# Patient Record
Sex: Female | Born: 1965
Health system: Southern US, Community
[De-identification: ages and names within clinical notes are randomized; demographics above are authoritative.]

## PROBLEM LIST (undated history)

## (undated) DIAGNOSIS — C801 Malignant (primary) neoplasm, unspecified: Secondary | ICD-10-CM

## (undated) DIAGNOSIS — K219 Gastro-esophageal reflux disease without esophagitis: Secondary | ICD-10-CM

## (undated) DIAGNOSIS — K8020 Calculus of gallbladder without cholecystitis without obstruction: Secondary | ICD-10-CM

## (undated) DIAGNOSIS — K589 Irritable bowel syndrome without diarrhea: Secondary | ICD-10-CM

## (undated) DIAGNOSIS — G43909 Migraine, unspecified, not intractable, without status migrainosus: Secondary | ICD-10-CM

## (undated) DIAGNOSIS — R32 Unspecified urinary incontinence: Secondary | ICD-10-CM

## (undated) DIAGNOSIS — J302 Other seasonal allergic rhinitis: Secondary | ICD-10-CM

## (undated) DIAGNOSIS — E785 Hyperlipidemia, unspecified: Secondary | ICD-10-CM

## (undated) DIAGNOSIS — Z8619 Personal history of other infectious and parasitic diseases: Secondary | ICD-10-CM

## (undated) DIAGNOSIS — T7840XA Allergy, unspecified, initial encounter: Secondary | ICD-10-CM

## (undated) DIAGNOSIS — Z87440 Personal history of urinary (tract) infections: Secondary | ICD-10-CM

## (undated) HISTORY — DX: Gastro-esophageal reflux disease without esophagitis: K21.9

## (undated) HISTORY — DX: Personal history of other infectious and parasitic diseases: Z86.19

## (undated) HISTORY — DX: Irritable bowel syndrome, unspecified: K58.9

## (undated) HISTORY — DX: Hyperlipidemia, unspecified: E78.5

## (undated) HISTORY — DX: Malignant (primary) neoplasm, unspecified: C80.1

## (undated) HISTORY — DX: Allergy, unspecified, initial encounter: T78.40XA

## (undated) HISTORY — DX: Calculus of gallbladder without cholecystitis without obstruction: K80.20

## (undated) HISTORY — DX: Migraine, unspecified, not intractable, without status migrainosus: G43.909

## (undated) HISTORY — DX: Personal history of urinary (tract) infections: Z87.440

## (undated) HISTORY — PX: MANDIBLE SURGERY: SHX707

## (undated) HISTORY — PX: DILATION AND CURETTAGE OF UTERUS: SHX78

## (undated) HISTORY — PX: COSMETIC SURGERY: SHX468

## (undated) HISTORY — DX: Unspecified urinary incontinence: R32

## (undated) HISTORY — DX: Other seasonal allergic rhinitis: J30.2

## (undated) HISTORY — PX: POLYPECTOMY: SHX149

---

## 2008-04-11 ENCOUNTER — Ambulatory Visit: Payer: Self-pay | Admitting: Family Medicine

## 2008-04-11 DIAGNOSIS — K589 Irritable bowel syndrome without diarrhea: Secondary | ICD-10-CM

## 2008-04-11 DIAGNOSIS — E785 Hyperlipidemia, unspecified: Secondary | ICD-10-CM

## 2008-04-11 LAB — CONVERTED CEMR LAB: Pap Smear: NORMAL

## 2008-05-02 ENCOUNTER — Ambulatory Visit: Payer: Self-pay | Admitting: Unknown Physician Specialty

## 2008-05-04 ENCOUNTER — Ambulatory Visit: Payer: Self-pay | Admitting: Family Medicine

## 2008-05-08 ENCOUNTER — Ambulatory Visit: Payer: Self-pay | Admitting: Family Medicine

## 2008-05-15 ENCOUNTER — Ambulatory Visit: Payer: Self-pay | Admitting: Family Medicine

## 2008-05-18 LAB — CONVERTED CEMR LAB
ALT: 20 units/L (ref 0–35)
Albumin: 3.6 g/dL (ref 3.5–5.2)
BUN: 17 mg/dL (ref 6–23)
CO2: 25 meq/L (ref 19–32)
Calcium: 8.9 mg/dL (ref 8.4–10.5)
Cholesterol: 235 mg/dL (ref 0–200)
Creatinine, Ser: 0.7 mg/dL (ref 0.4–1.2)
Direct LDL: 158.6 mg/dL
Glucose, Bld: 75 mg/dL (ref 70–99)
Total CHOL/HDL Ratio: 5.7
Total Protein: 8.3 g/dL (ref 6.0–8.3)
Triglycerides: 124 mg/dL (ref 0–149)

## 2008-06-22 ENCOUNTER — Ambulatory Visit: Payer: Self-pay | Admitting: Family Medicine

## 2008-06-22 LAB — CONVERTED CEMR LAB
Bilirubin Urine: NEGATIVE
Specific Gravity, Urine: 1.015
Urobilinogen, UA: 0.2

## 2008-08-17 ENCOUNTER — Ambulatory Visit: Payer: Self-pay | Admitting: Family Medicine

## 2008-08-18 LAB — CONVERTED CEMR LAB
ALT: 15 units/L (ref 0–35)
AST: 21 units/L (ref 0–37)
HDL: 37.8 mg/dL — ABNORMAL LOW (ref >39.00)
Triglycerides: 180 mg/dL — ABNORMAL HIGH (ref 0.0–149.0)
VLDL: 36 mg/dL (ref 0.0–40.0)

## 2008-08-23 ENCOUNTER — Ambulatory Visit: Payer: Self-pay | Admitting: Family Medicine

## 2008-11-27 ENCOUNTER — Ambulatory Visit: Payer: Self-pay | Admitting: Family Medicine

## 2008-11-28 LAB — CONVERTED CEMR LAB
ALT: 14 units/L (ref 0–35)
AST: 21 units/L (ref 0–37)
HDL: 39.9 mg/dL (ref >39.00)
Total CHOL/HDL Ratio: 6

## 2008-12-23 ENCOUNTER — Emergency Department: Payer: Self-pay | Admitting: Emergency Medicine

## 2009-05-03 ENCOUNTER — Ambulatory Visit: Payer: Self-pay | Admitting: Unknown Physician Specialty

## 2009-07-11 ENCOUNTER — Ambulatory Visit: Payer: Self-pay | Admitting: Family Medicine

## 2009-09-04 ENCOUNTER — Ambulatory Visit: Payer: Self-pay | Admitting: Family Medicine

## 2009-09-04 DIAGNOSIS — B354 Tinea corporis: Secondary | ICD-10-CM | POA: Insufficient documentation

## 2010-04-11 ENCOUNTER — Telehealth: Payer: Self-pay | Admitting: Family Medicine

## 2010-05-07 ENCOUNTER — Ambulatory Visit: Payer: Self-pay | Admitting: Unknown Physician Specialty

## 2010-06-05 NOTE — Progress Notes (Signed)
Summary: ? UTI  Phone Note Call from Patient Call back at Work Phone [phone]   Caller: Patient Summary of Call: Pt thinks she has a UTI and is asking if she can come in and drop off a urine sample, there are no appts available. Initial call taken by: Lowella Petties CMA, AAMA,  April 11, 2010 8:34 AM  Follow-up for Phone Call        let her know there was a policy change and we can no longer tx without visit offer first avail  if none-- UC  Follow-up by: Judith Part MD,  April 11, 2010 9:26 AM  Additional Follow-up for Phone Call Additional follow up Details #1::        Advised pt, she will call back tomorrow for a same day appt. Additional Follow-up by: Lowella Petties CMA, AAMA,  April 11, 2010 11:15 AM

## 2010-06-05 NOTE — Assessment & Plan Note (Signed)
Summary: COLD/CLE   Vital Signs:  Patient profile:   45 year old female Height:      66.25 inches Weight:      150.50 pounds BMI:     24.20 Temp:     98.4 degrees F oral Pulse rate:   68 / minute Pulse rhythm:   regular BP sitting:   100 / 70  (left arm) Cuff size:   regular  Vitals Entered By: Lewanda Rife LPN (July 11, 3084 9:21 AM)  History of Present Illness: 11 days of uri symptoms - congestion and cough  is improved but not gone  still blowing nose -- mostly clear d/c  couging is persistant- with rattle and loose mucous- but not getting it out  no fever  no facial pain   no GI symptoms   throat and ears are ok   was taking tylenol cold- not helpful   ? if this is related to allergies as well   Allergies: 1)  ! Amoxicillin 2)  ! Claritin 3)  ! 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 Eyes:  Denies blurring and eye irritation. ENT:  Complains of nasal congestion, postnasal drainage, and sore throat; denies earache and sinus pressure. CV:  Denies chest pain or discomfort and  palpitations. Resp:  Complains of cough; denies wheezing. GI:  Denies diarrhea, nausea, and vomiting. Derm:  Denies rash.  Physical Exam  General:  Well-developed,well-nourished,in no acute distress; alert,appropriate and cooperative throughout examination Head:  normocephalic, atraumatic, and no abnormalities observed.  no sinus tenderness  Eyes:  vision grossly intact, pupils equal, pupils round, pupils reactive to light, and no injection.   Ears:  R ear normal and L ear normal.   Nose:  nares are boggy and mildly congested  Mouth:  pharynx pink and moist, no erythema, and no exudates.   Neck:  No deformities, masses, or tenderness noted. Lungs:  Normal respiratory effort, chest expands symmetrically. Lungs are clear to auscultation, no crackles or wheezes. Heart:  Normal rate and regular rhythm. S1 and S2 normal without gallop, murmur, click, rub or other extra sounds. Skin:  Intact without suspicious lesions or rashes Cervical Nodes:  No lymphadenopathy noted Psych:  normal affect, talkative and pleasant    Impression & Recommendations:  Problem # 1:  URI (ICD-465.9) Assessment New with nasal symptoms and persistant cough no signs of bact infx on exam  rec allegra for nasal symptoms (is intol to claritin and zyrtec) tessalon for cough recommend sympt care- see pt instructions   pt advised to update me if symptoms worsen or do not improve  Her updated medication list for this problem includes:    Allegra 180 Mg Tabs (  Fexofenadine hcl) .Marland Kitchen... 1 by mouth once daily as needed runny nose/ congestion    Tessalon 200 Mg Caps (Benzonatate) .Marland Kitchen... 1 by mouth up to three times a day as needed cough  Complete Medication List: 1)  Trivora (28) Tabs (Levonorg-eth estrad triphasic) .... One by mouth daily as directed 2)  Flax Oil (Flaxseed (linseed)) .... Take one daily 3)  Allegra 180 Mg Tabs (Fexofenadine hcl) .Marland Kitchen.. 1 by mouth once daily as needed runny nose/ congestion 4)  Tessalon 200  Mg Caps (Benzonatate) .Marland Kitchen.. 1 by mouth up to three times a day as needed cough  Patient Instructions: 1)  drink lots of fluids 2)  try tessalon for cough as needed 3)  try the allegra for nasal symptoms and also allergies  4)  if worse or facial pain or fever or not improved in a week- please call and let me know  Prescriptions: TESSALON 200 MG CAPS (BENZONATATE) 1 by mouth up to three times a day as needed cough  #30 x 0   Entered and Authorized by:   Judith Part MD   Signed by:   Judith Part MD on 07/11/2009   Method used:   Electronically to        [facility] (retail)       [address]       [place], [place]  [address]       Ph: [phone] or [phone]       Fax: [phone]   RxID:   [id number] ALLEGRA 180 MG TABS (FEXOFENADINE HCL) 1 by mouth once daily as needed runny nose/ congestion  #30 x 11   Entered and Authorized by:   Judith Part MD   Signed by:   Judith Part MD on 07/11/2009   Method used:   [facility]        [facility] (retail)       [address]       [place], [place]  [address]       Ph: [phone] or [phone]       Fax: [phone]   RxID:   [id number]   Current Allergies (reviewed today): ! AMOXICILLIN ! CLARITIN ! ZYRTEC ALLERGY

## 2010-06-05 NOTE — Assessment & Plan Note (Signed)
Summary: CHECK PLACE ON BEHIND/CLE   Vital Signs:  Patient profile:   45 year old female Height:      66.25 inches Weight:      153.25 pounds BMI:     24.64 Temp:     98.2 degrees F oral Pulse rate:   68 / minute Pulse rhythm:   regular BP sitting:   100 / 70  (left arm) Cuff size:   regular  Vitals Entered By: Lewanda Rife LPN (Sep 05, 2954 4:04 PM) CC: ck rashy and itchy area on buttock   History of Present Illness: itchy rash on buttock  6 months - went away and then came back   is really hard for her to see  ? feels like whelps  does try not to scratch no oozing   no new products at all   no hx of herpes of any type   of note- she did have a bad vaginal yeast infx 6 mo ago that required pill and topical tx- that is better  Allergies: 1)  ! Amoxicillin 2)  ! Claritin 3)  ! 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 Eyes:  Denies discharge and halos. CV:  Denies chest pain or discomfort and palpitations. Resp:  Denies cough. GI:  Denies change in bowel habits, nausea, and vomiting. MS:   Denies joint pain. Derm:  Complains of itching, lesion(s), and rash. Neuro:  Denies numbness and tingling.  Physical Exam  General:  Well-developed,well-nourished,in no acute distress; alert,appropriate and cooperative throughout examination Head:  normocephalic, atraumatic, and no abnormalities observed.   Mouth:  pharynx pink and moist.   Neck:  No deformities, masses, or tenderness noted. Lungs:  Normal respiratory effort, chest expands symmetrically. Lungs are clear to auscultation, no crackles or wheezes. Heart:  Normal rate and regular rhythm. S1 and S2 normal without gallop, murmur, click, rub or other extra sounds. Abdomen:  no suprapubic tenderness or fullness felt  Neurologic:  sensation intact to light touch, gait normal, and DTRs symmetrical and normal.   Skin:  skin on outer buttocks-- left more than R - several oval lesions of scale/ erythema and central clearing no vesicle formation / no plaque (is only slt raised) anal area is clear  Inguinal Nodes:  No significant adenopathy Psych:  normal affect, talkative and pleasant    Impression & Recommendations:  Problem # 1:  TINEA CORPORIS (ICD-110.5) Assessment New with affected area on outer buttocks - itchy and resembling ringworm will tx with lotrisone cream two times a day  adv to keep clean and dry update if not much imp in 10d- would consider derm eval   Complete Medication List: 1)  Trivora (28)  Tabs (Levonorg-eth estrad triphasic) .... One by mouth daily as directed 2)  Flax Oil (Flaxseed (linseed)) .... Take one daily 3)  Allegra 180 Mg Tabs (Fexofenadine hcl) .Marland Kitchen.. 1 by mouth once daily as needed runny nose/ congestion 4)  Lotrisone 1-0.05 % Crea (Clotrimazole-betamethasone) .... Apply to affected area two times a day as needed  Patient Instructions: 1)  keep area as clean/ cool and dry as possible  2)  use the lotrisone cream two times a day until clear  3)  I sent to your pharmacy  4)  update me in 10-14  days if not improving  Prescriptions: LOTRISONE 1-0.05 % CREA (CLOTRIMAZOLE-BETAMETHASONE) apply to affected area two times a day as needed  #1 medium x 0   Entered and Authorized by:   Judith Part MD   Signed by:   Judith Part MD on 09/04/2009   Method used:   Electronically to        [facility] (retail)       [address]       [place], [place]  [address]       Ph: [phone]       Fax: [phone]   RxID:   [id number]   Current Allergies (reviewed today): ! AMOXICILLIN ! CLARITIN ! ZYRTEC ALLERGY

## 2011-10-03 ENCOUNTER — Other Ambulatory Visit: Payer: Self-pay

## 2011-10-06 ENCOUNTER — Telehealth: Payer: Self-pay | Admitting: Family Medicine

## 2011-10-06 ENCOUNTER — Other Ambulatory Visit (INDEPENDENT_AMBULATORY_CARE_PROVIDER_SITE_OTHER): Payer: Self-pay

## 2011-10-06 DIAGNOSIS — Z0000 Encounter for general adult medical examination without abnormal findings: Secondary | ICD-10-CM | POA: Insufficient documentation

## 2011-10-06 DIAGNOSIS — E785 Hyperlipidemia, unspecified: Secondary | ICD-10-CM

## 2011-10-06 LAB — CBC WITH DIFFERENTIAL/PLATELET
Basophils Absolute: 0 10*3/uL (ref 0.0–0.1)
Eosinophils Relative: 0 % (ref 0.0–5.0)
Lymphs Abs: 0.8 10*3/uL (ref 0.7–4.0)
Monocytes Relative: 9.8 % (ref 3.0–12.0)
Neutrophils Relative %: 69 % (ref 43.0–77.0)
Platelets: 243 10*3/uL (ref 150.0–400.0)
RDW: 13.4 % (ref 11.5–14.6)
WBC: 4 10*3/uL — ABNORMAL LOW (ref 4.5–10.5)

## 2011-10-06 LAB — COMPREHENSIVE METABOLIC PANEL
ALT: 14 U/L (ref 0–35)
Albumin: 3.7 g/dL (ref 3.5–5.2)
Alkaline Phosphatase: 46 U/L (ref 39–117)
CO2: 26 mEq/L (ref 19–32)
GFR: 93.71 mL/min (ref >60.00)
Glucose, Bld: 78 mg/dL (ref 70–99)
Potassium: 4.3 mEq/L (ref 3.5–5.1)
Sodium: 141 mEq/L (ref 135–145)
Total Bilirubin: 0.5 mg/dL (ref 0.3–1.2)
Total Protein: 7.6 g/dL (ref 6.0–8.3)

## 2011-10-06 LAB — LIPID PANEL
Cholesterol: 203 mg/dL — ABNORMAL HIGH (ref 0–200)
Total CHOL/HDL Ratio: 4
VLDL: 19.8 mg/dL (ref 0.0–40.0)

## 2011-10-06 LAB — LDL CHOLESTEROL, DIRECT: Direct LDL: 139.5 mg/dL

## 2011-10-06 NOTE — Telephone Encounter (Signed)
Message copied by Judy Pimple on Mon Oct 06, 2011  1:35 PM ------      Message from: Alvina Chou      Created: Mon Oct 06, 2011  9:02 AM      Regarding: labs today       Patient is scheduled for CPX labs, please order future labs, Thanks , Terri             Blood in lab, thanks

## 2011-10-08 ENCOUNTER — Encounter: Payer: Self-pay | Admitting: Family Medicine

## 2011-10-08 ENCOUNTER — Ambulatory Visit (INDEPENDENT_AMBULATORY_CARE_PROVIDER_SITE_OTHER): Payer: 59 | Admitting: Family Medicine

## 2011-10-08 VITALS — BP 100/68 | HR 72 | Temp 98.0°F | Ht 65.75 in | Wt 160.5 lb

## 2011-10-08 DIAGNOSIS — E785 Hyperlipidemia, unspecified: Secondary | ICD-10-CM

## 2011-10-08 DIAGNOSIS — Z0000 Encounter for general adult medical examination without abnormal findings: Secondary | ICD-10-CM

## 2011-10-08 DIAGNOSIS — Z23 Encounter for immunization: Secondary | ICD-10-CM

## 2011-10-08 NOTE — Patient Instructions (Signed)
Tdap vaccine today  Avoid red meat/ fried foods/ egg yolks/ fatty breakfast meats/ butter, cheese and high fat dairy/ and shellfish   Here is a handout on cholesterol  Try to work in exercise 5 days per week -- work up to 30 minutes  It will help your energy level too  Make follow up appt for your other issues when able

## 2011-10-08 NOTE — Assessment & Plan Note (Signed)
Reviewed health habits including diet and exercise and skin cancer prevention Also reviewed health mt list, fam hx and immunizations   Reviewed wellness labs in detail Rev plan for low chol diet and also exercise program 5 d per week  Tdap today

## 2011-10-08 NOTE — Progress Notes (Signed)
Subjective:    Patient ID: Nicole Shelton, female    DOB: 23-Apr-1966, 46 y.o.   MRN: [id number]  HPI Here for health maintenance exam and to review chronic medical problems    Also has some new problems - acid reflux/ stomach issues and also hand tingling  Otherwise doing fine   Is taking good care of herself   Last gyn visit was in February - Dr Genice Rouge  Had an exam -- is not doing the pap every single year  Had a mammo in feb as well - that was normal   Did not get a flu shot this year - may consider in the future   Is not on OC now  Husband had a vasectomy -- and is happy with that   No new family hx of anything except neuropathy in her mother (no particular etiol) - leg pain   No meds at all now  No supplements   Wt is up 7 lb with bmi 26  Diet is not optimal  Eating on the run and eating while busy  Gaining more around the middle Does not exercise at all   Is difficult to fit exercise into her schedule  Job and family  Is getting enough sleep at night   Td - ? When the last one was --will do that today     Chemistry      Component Value Date/Time   NA 141 10/06/2011 1401   K 4.3 10/06/2011 1401   CL 108 10/06/2011 1401   CO2 26 10/06/2011 1401   BUN 12 10/06/2011 1401   CREATININE 0.7 10/06/2011 1401      Component Value Date/Time   CALCIUM 8.7 10/06/2011 1401   ALKPHOS 46 10/06/2011 1401   AST 19 10/06/2011 1401   ALT 14 10/06/2011 1401   BILITOT 0.5 10/06/2011 1401      Lab Results  Component Value Date   WBC 4.0* 10/06/2011   HGB 12.7 10/06/2011   HCT 36.7 10/06/2011   MCV 86.7 10/06/2011   PLT 243.0 10/06/2011    Lab Results  Component Value Date   TSH 1.88 10/06/2011    Lab Results  Component Value Date   CHOL 203* 10/06/2011   CHOL 225* 11/27/2008   CHOL 223* 08/17/2008   Lab Results  Component Value Date   HDL 45.40 10/06/2011   HDL 39.90 11/27/2008   HDL 29.52* 08/17/2008   No results found for this basename: LDLCALC   Lab Results  Component Value Date   TRIG 99.0 10/06/2011   TRIG 142.0 11/27/2008   TRIG 180.0* 08/17/2008   Lab Results  Component Value Date   CHOLHDL 4 10/06/2011   CHOLHDL 6 11/27/2008   CHOLHDL 6 08/17/2008   Lab Results  Component Value Date   LDLDIRECT 139.5 10/06/2011   LDLDIRECT 149.0 11/27/2008   LDLDIRECT 144.0 08/17/2008   does have fam hx of high chol  She has had high chol all her life  Diet is fair  Knows to avoid fried foods / eat more fresh vegetables/ does eat some cheese  occ eggs- not every day  Does have occ bacon or sausage  Does eat shrimp occasionally  Does eat red meat - more chicken   Patient Active Problem List  Diagnoses  . HYPERLIPIDEMIA  . IRRITABLE BOWEL SYNDROME  . Routine general medical examination at a health care facility   Past Medical History  Diagnosis Date  . History of chicken pox   .  Seasonal allergies   . Hyperlipemia   . History of recurrent UTIs    Past Surgical History  Procedure Date  . Dilation and curettage of uterus    History  Substance Use Topics  . Smoking status: Never Smoker   . Smokeless tobacco: Not on file  . Alcohol Use: No   Family History  Problem Relation Age of Onset  . Hyperlipidemia Mother     on medication recently  . Pulmonary fibrosis Father     double lung transplant  . Arthritis Other   . Cancer Other     bladder  . Heart disease Other    Allergies  Allergen Reactions  . Amoxicillin     REACTION: back, leg pain  . Cetirizine Hcl     REACTION: sedation  . Loratadine     REACTION: sedation   No current outpatient prescriptions on file prior to visit.      Review of Systems Review of Systems  Constitutional: Negative for fever, appetite change, fatigue and unexpected weight change.  Eyes: Negative for pain and visual disturbance.  Respiratory: Negative for cough and shortness of breath.   Cardiovascular: Negative for cp or palpitations    Gastrointestinal: Negative for nausea, diarrhea and constipation. pos for  indigestion/heartburn Genitourinary: Negative for urgency and frequency.  Skin: Negative for pallor or rash   Neurological: Negative for weakness, light-headedness,and headaches.pos for some numbness in hands at times   Hematological: Negative for adenopathy. Does not bruise/bleed easily.  Psychiatric/Behavioral: Negative for dysphoric mood. The patient is not nervous/anxious.         Objective:   Physical Exam  Constitutional: She appears well-developed and well-nourished. No distress.  HENT:  Head: Normocephalic and atraumatic.  Right Ear: External ear normal.  Left Ear: External ear normal.  Nose: Nose normal.  Mouth/Throat: Oropharynx is clear and moist.  Eyes: Conjunctivae and EOM are normal. Pupils are equal, round, and reactive to light. No scleral icterus.  Neck: Normal range of motion. Neck supple. No JVD present. Carotid bruit is not present. No thyromegaly present.  Cardiovascular: Normal rate, regular rhythm and intact distal pulses.  Exam reveals no gallop.   Pulmonary/Chest: Effort normal and breath sounds normal. No respiratory distress. She has no wheezes.  Abdominal: Soft. Bowel sounds are normal. She exhibits no distension, no abdominal bruit and no mass. There is no tenderness.  Musculoskeletal: Normal range of motion. She exhibits no edema and no tenderness.  Lymphadenopathy:    She has no cervical adenopathy.  Neurological: She is alert. She has normal reflexes. No cranial nerve deficit. She exhibits normal muscle tone. Coordination normal.  Skin: Skin is warm and dry. No rash noted. No erythema. No pallor.       Solar lentigos diffusely   Psychiatric: She has a normal mood and affect.          Assessment & Plan:

## 2011-10-08 NOTE — Assessment & Plan Note (Signed)
Disc goals for lipids and reasons to control them Rev labs with pt Rev low sat fat diet in detail  Goal is LDL 130 or less - will work on diet and continue to follow Consider statin if it goes up or cannot get to goal

## 2011-11-03 ENCOUNTER — Encounter: Payer: Self-pay | Admitting: Family Medicine

## 2011-11-03 ENCOUNTER — Ambulatory Visit (INDEPENDENT_AMBULATORY_CARE_PROVIDER_SITE_OTHER): Payer: 59 | Admitting: Family Medicine

## 2011-11-03 VITALS — BP 100/60 | HR 72 | Temp 97.5°F | Ht 65.75 in | Wt 161.2 lb

## 2011-11-03 DIAGNOSIS — M62838 Other muscle spasm: Secondary | ICD-10-CM

## 2011-11-03 DIAGNOSIS — M7918 Myalgia, other site: Secondary | ICD-10-CM

## 2011-11-03 DIAGNOSIS — IMO0001 Reserved for inherently not codable concepts without codable children: Secondary | ICD-10-CM

## 2011-11-03 NOTE — Progress Notes (Signed)
   Nature conservation officer at [facility] [address] [place] [address] Phone: [phone] Fax: [phone]   Patient Name: Nicole Shelton Date of Birth: [birth year] Age: 46 y.o. Medical Record Number: [id number] Gender: female Date of Encounter: 11/03/2011  Chief Complaint: Mass   History of Present Illness:  Nicole Shelton is a 46 y.o. very pleasant female patient who presents with the following:  Primarily with right-sided rhomboid and trapezius muscle spasm and strain in the lower portion. She also has some mild neck pain. No trauma or accident. 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 Occasional right-sided paresthesias, in the hand, but none currently and these are intermittent. She takes Motrin last night, and this helped a good bit.  No prior injury or trauma, fracture, or operative intervention in the neck  Past Medical History, Surgical History, Social History, Family History, Problem List, Medications, and Allergies have been reviewed and updated if relevant.  No current outpatient prescriptions on file prior to visit.    Review of Systems:  GEN: No fevers, chills. Nontoxic. Primarily MSK c/o today. MSK: Detailed in the HPI GI: tolerating PO intake without difficulty Neuro: detailed above Otherwise the pertinent positives of the ROS are noted above.    Physical Examination: Filed Vitals:   11/03/11 1132  BP: 100/60  Pulse: 72  Temp: 97.5 F (36.4 C)   Filed Vitals:   11/03/11 1132  Height: 5' 5.75" (1.67 m)  Weight: 161 lb 4 oz (73.143 kg)   Body mass index is 26.22 kg/(m^2). Ideal Body Weight: Weight in (lb) to have BMI = 25: 153.4    GEN: Well-developed,well-nourished,in no acute distress; alert,appropriate and cooperative throughout examination HEENT: Normocephalic and atraumatic without obvious abnormalities. Ears, externally no deformities PULM: Breathing comfortably in no respiratory distress EXT: No  clubbing, cyanosis, or edema PSYCH: Normally interactive. Cooperative during the interview. Pleasant. Friendly and conversant. Not anxious or depressed appearing. Normal, full affect.  CERVICAL SPINE EXAM Range of motion: Flexion, extension, lateral bending, and rotation: full Pain with terminal motion: no Spinous Processes: NT SCM: NT Upper paracervical muscles: minimally tender Upper traps: NT Low traps tender to palpation, more on the right. C5-T1 intact, sensation and motor  Followup extremity 5/5 strength.  Some mild scapular dyskinesis and poor posture   EKG / Xrays / Labs: None available at the time of encounter.  Assessment and Plan:  1. Rhomboid muscle pain   2. Trapezius muscle spasm    >25 minutes spent in face to face time with patient, >50% spent in counselling or coordination of care: Reviewed all relevant anatomy. Discussed postural control, scapular stabilization as well as some basic neck rehabilitation.  Orders Today: No orders of the defined types were placed in this encounter.    Medications Today: No orders of the defined types were placed in this encounter.     Hannah Beat, MD

## 2011-11-03 NOTE — Patient Instructions (Signed)
Google "[facility]"  Click on the videos section  Neck

## 2012-01-27 ENCOUNTER — Other Ambulatory Visit: Payer: Self-pay | Admitting: Gastroenterology

## 2012-05-05 DIAGNOSIS — K8020 Calculus of gallbladder without cholecystitis without obstruction: Secondary | ICD-10-CM

## 2012-05-05 HISTORY — PX: CHOLECYSTECTOMY: SHX55

## 2012-05-05 HISTORY — DX: Calculus of gallbladder without cholecystitis without obstruction: K80.20

## 2012-10-15 ENCOUNTER — Ambulatory Visit (INDEPENDENT_AMBULATORY_CARE_PROVIDER_SITE_OTHER): Payer: 59 | Admitting: Family Medicine

## 2012-10-15 ENCOUNTER — Encounter: Payer: Self-pay | Admitting: Family Medicine

## 2012-10-15 VITALS — BP 122/80 | HR 73 | Temp 98.4°F | Ht 65.75 in | Wt 158.5 lb

## 2012-10-15 DIAGNOSIS — S90111A Contusion of right great toe without damage to nail, initial encounter: Secondary | ICD-10-CM

## 2012-10-15 DIAGNOSIS — S90112A Contusion of left great toe without damage to nail, initial encounter: Secondary | ICD-10-CM

## 2012-10-15 DIAGNOSIS — S90129A Contusion of unspecified lesser toe(s) without damage to nail, initial encounter: Secondary | ICD-10-CM

## 2012-10-15 NOTE — Progress Notes (Signed)
  Subjective:    Patient ID: Nicole Shelton, female    DOB: 31-Aug-1965, 47 y.o.   MRN: [id number]  HPI Here with color change in her toenails  Noticed over the past week  Not hurting and not coming off  No finger nail changes Feels fine   Patient Active Problem List   Diagnosis Date Noted  . Routine general medical examination at a health care facility 10/06/2011  . HYPERLIPIDEMIA 04/11/2008  . IRRITABLE BOWEL SYNDROME 04/11/2008   Past Medical History  Diagnosis Date  . History of chicken pox   . Seasonal allergies   . Hyperlipemia   . History of recurrent UTIs    Past Surgical History  Procedure Laterality Date  . Dilation and curettage of uterus     History  Substance Use Topics  . Smoking status: Never Smoker   . Smokeless tobacco: Not on file  . Alcohol Use: No   Family History  Problem Relation Age of Onset  . Hyperlipidemia Mother     on medication recently  . Pulmonary fibrosis Father     double lung transplant  . Arthritis Other   . Cancer Other     bladder  . Heart disease Other    Allergies  Allergen Reactions  . Amoxicillin     REACTION: back, leg pain  . Cetirizine Hcl     REACTION: sedation  . Loratadine     REACTION: sedation   No current outpatient prescriptions on file prior to visit.   No current facility-administered medications on file prior to visit.     Review of Systems Review of Systems  Constitutional: Negative for fever, appetite change, fatigue and unexpected weight change.  Eyes: Negative for pain and visual disturbance.  Respiratory: Negative for cough and shortness of breath.   Cardiovascular: Negative for cp or palpitations    Gastrointestinal: Negative for nausea, diarrhea and constipation.  Genitourinary: Negative for urgency and frequency.  Skin: Negative for pallor or rash   Neurological: Negative for weakness, light-headedness, numbness and headaches.  Hematological: Negative for adenopathy. Does not  bruise/bleed easily.  Psychiatric/Behavioral: Negative for dysphoric mood. The patient is not nervous/anxious.         Objective:   Physical Exam  Constitutional: She appears well-developed and well-nourished.  HENT:  Head: Normocephalic and atraumatic.  Eyes: Conjunctivae and EOM are normal. Pupils are equal, round, and reactive to light. No scleral icterus.  Neck: Normal range of motion. Neck supple.  Cardiovascular: Normal rate, regular rhythm and intact distal pulses.   No murmur heard. Neurological: She is alert.  Skin: Skin is warm and dry. No rash noted. No erythema. No pallor.  Symmetric ecchymosis under both great toenails -in medial inner corner (less than 1 cm) and non tender Nails appear healthy No thickening of splinter hemorrhages Finger nails appear nl  Psychiatric: She has a normal mood and affect.          Assessment & Plan:

## 2012-10-15 NOTE — Patient Instructions (Addendum)
I think you have bruising under both large toenails  This is usually due to trauma - ? Perhaps particular shoes Keep watching the areas of color change- they should fade some and then begin to grow out If the condition worsens please let me know

## 2012-10-15 NOTE — Assessment & Plan Note (Signed)
Both great toenails have areas of bruising (less than 1 cm) in medial inner corners- without apparent nail damage or skin change or tenderness  Since symmetric- suspect this much be traumatic from a particular pair of shoes Pt will pay attention to this  Watch for color change and growing out of ecchymosis and update if not imp

## 2012-11-01 ENCOUNTER — Ambulatory Visit (INDEPENDENT_AMBULATORY_CARE_PROVIDER_SITE_OTHER): Payer: 59 | Admitting: Family Medicine

## 2012-11-01 ENCOUNTER — Encounter: Payer: Self-pay | Admitting: Family Medicine

## 2012-11-01 VITALS — BP 114/72 | HR 71 | Temp 98.1°F | Ht 65.75 in | Wt 159.5 lb

## 2012-11-01 DIAGNOSIS — K589 Irritable bowel syndrome without diarrhea: Secondary | ICD-10-CM

## 2012-11-01 DIAGNOSIS — R109 Unspecified abdominal pain: Secondary | ICD-10-CM | POA: Insufficient documentation

## 2012-11-01 DIAGNOSIS — E785 Hyperlipidemia, unspecified: Secondary | ICD-10-CM

## 2012-11-01 LAB — BASIC METABOLIC PANEL
BUN: 13 mg/dL (ref 6–23)
Calcium: 8.8 mg/dL (ref 8.4–10.5)
Chloride: 109 mEq/L (ref 96–112)
Creatinine, Ser: 0.6 mg/dL (ref 0.4–1.2)
GFR: 106.83 mL/min (ref >60.00)

## 2012-11-01 LAB — CBC WITH DIFFERENTIAL/PLATELET
Basophils Relative: 0.4 % (ref 0.0–3.0)
Eosinophils Relative: 0 % (ref 0.0–5.0)
Hemoglobin: 13.1 g/dL (ref 12.0–15.0)
Lymphocytes Relative: 24.1 % (ref 12.0–46.0)
Neutro Abs: 2.9 10*3/uL (ref 1.4–7.7)
Neutrophils Relative %: 66.5 % (ref 43.0–77.0)
RBC: 4.41 Mil/uL (ref 3.87–5.11)
WBC: 4.4 10*3/uL — ABNORMAL LOW (ref 4.5–10.5)

## 2012-11-01 LAB — AMYLASE: Amylase: 42 U/L (ref 27–131)

## 2012-11-01 LAB — HEPATIC FUNCTION PANEL
ALT: 14 U/L (ref 0–35)
AST: 17 U/L (ref 0–37)
Albumin: 4 g/dL (ref 3.5–5.2)
Alkaline Phosphatase: 42 U/L (ref 39–117)
Bilirubin, Direct: 0 mg/dL (ref 0.0–0.3)
Total Protein: 7.9 g/dL (ref 6.0–8.3)

## 2012-11-01 NOTE — Progress Notes (Signed)
Subjective:    Patient ID: Nicole Shelton, female    DOB: 1965/07/21, 47 y.o.   MRN: [id number]  HPI Here for stomach pain  Whole upper body hurts at times  gas - a lot of  Sometimes cramps double her over Diarrhea as well  Feels tired and does not know why   She has had problems for years - IBS- but much worse for the last few weeks   No n/v No blood in stool or black colored stool prilosec otc is not helping  Diet has not changed   All foods bother her  Is even uncomfortable to lie on her stomach - pain goes through to her back  Not a lot of heartburn   No specific R sided pain - is all over   No hx of abd surgeries   Patient Active Problem List   Diagnosis Date Noted  . Contusion of left great toe without damage to nail 10/15/2012  . Contusion of right great toe without damage to nail 10/15/2012  . Routine general medical examination at a health care facility 10/06/2011  . HYPERLIPIDEMIA 04/11/2008  . IRRITABLE BOWEL SYNDROME 04/11/2008   Past Medical History  Diagnosis Date  . History of chicken pox   . Seasonal allergies   . Hyperlipemia   . History of recurrent UTIs    Past Surgical History  Procedure Laterality Date  . Dilation and curettage of uterus     History  Substance Use Topics  . Smoking status: Never Smoker   . Smokeless tobacco: Not on file  . Alcohol Use: No   Family History  Problem Relation Age of Onset  . Hyperlipidemia Mother     on medication recently  . Pulmonary fibrosis Father     double lung transplant  . Arthritis Other   . Cancer Other     bladder  . Heart disease Other    Allergies  Allergen Reactions  . Amoxicillin     REACTION: back, leg pain  . Cetirizine Hcl     REACTION: sedation  . Loratadine     REACTION: sedation   No current outpatient prescriptions on file prior to visit.   No current facility-administered medications on file prior to visit.      Review of Systems Review of Systems   Constitutional: Negative for fever, appetite change, fatigue and unexpected weight change.  Eyes: Negative for pain and visual disturbance.  ENT neg for mouth ulcers Respiratory: Negative for cough and shortness of breath.   Cardiovascular: Negative for cp or palpitations    Gastrointestinal: Negative for nausea, vomiting/ constipation and blood or mucous in stool, pos for abd pain and bloating  Genitourinary: Negative for urgency and frequency.  Skin: Negative for pallor or rash   Neurological: Negative for weakness, light-headedness, numbness and headaches.  Hematological: Negative for adenopathy. Does not bruise/bleed easily.  Psychiatric/Behavioral: Negative for dysphoric mood. The patient is not nervous/anxious.         Objective:   Physical Exam  Constitutional: She appears well-developed and well-nourished. No distress.  HENT:  Head: Normocephalic and atraumatic.  Mouth/Throat: Oropharynx is clear and moist.  Eyes: Conjunctivae and EOM are normal. Pupils are equal, round, and reactive to light. Right eye exhibits no discharge. Left eye exhibits no discharge. No scleral icterus.  Neck: Normal range of motion. Neck supple. No JVD present. Carotid bruit is not present. No thyromegaly present.  Cardiovascular: Normal rate and regular rhythm.   Pulmonary/Chest: Effort normal  and breath sounds normal.  Abdominal: Soft. Normal aorta and bowel sounds are normal. She exhibits no distension, no abdominal bruit, no ascites, no pulsatile midline mass and no mass. There is no hepatosplenomegaly. There is generalized tenderness. There is no rigidity, no rebound, no guarding, no CVA tenderness, no tenderness at McBurney's point and negative Murphy's sign.  Musculoskeletal: She exhibits no edema.  Lymphadenopathy:    She has no cervical adenopathy.  Neurological: She is alert. She has normal reflexes.  Skin: Skin is warm and dry. No pallor.  No jaundice  Psychiatric: She has a normal mood and  affect.          Assessment & Plan:

## 2012-11-01 NOTE — Assessment & Plan Note (Signed)
This seems to play a role in current complaints - bloating and stool changes (intermittent) Adv to start citrucel daily  Lab today F/u after abd [place]

## 2012-11-01 NOTE — Assessment & Plan Note (Signed)
Upper primarily with bloating and pain rad to back at times  sched abd [place] (look for gallstones and also AAA) Stop prilosec if not helpful Lab panel F/u after [place]

## 2012-11-01 NOTE — Patient Instructions (Addendum)
We will refer you for abdominal ultrasound at check out  Labs today  Eat bland diet  Take citrucel fiber supplement once daily mixed with water as directed  Follow up with me a week after ultrasound Update if not starting to improve in a week or if worsening

## 2012-11-03 ENCOUNTER — Ambulatory Visit: Payer: Self-pay | Admitting: Family Medicine

## 2012-11-03 ENCOUNTER — Telehealth: Payer: Self-pay

## 2012-11-03 ENCOUNTER — Encounter: Payer: Self-pay | Admitting: Family Medicine

## 2012-11-03 DIAGNOSIS — K8020 Calculus of gallbladder without cholecystitis without obstruction: Secondary | ICD-10-CM | POA: Insufficient documentation

## 2012-11-03 DIAGNOSIS — R109 Unspecified abdominal pain: Secondary | ICD-10-CM

## 2012-11-03 NOTE — Telephone Encounter (Signed)
Pt left v/m requesting cb [phone]; pt did not say she wanted referral to surgeon; pt was asking if needed f/u appt with Dr Milinda Antis next week to discuss next step. Left v/m for pt to cb. (unable to get into mychart to add to Dr Royden Purl note).

## 2012-11-03 NOTE — Telephone Encounter (Signed)
Ref to gen surg for gallstones  

## 2012-11-03 NOTE — Telephone Encounter (Signed)
Pt left v/m requesting cb. I called pt back and left v/m for pt to cb.

## 2012-11-03 NOTE — Addendum Note (Signed)
Addended by: Roxy Manns A on: 11/03/2012 06:15 PM   Modules accepted: Orders

## 2012-11-03 NOTE — Telephone Encounter (Signed)
She can cancel the follow up with me - because the gallstones are likely the problem- please ask if agreeable with surgical referral and I will do it

## 2012-11-03 NOTE — Telephone Encounter (Signed)
F/u appt canceled and pt agrees with referral I advise pt that Shirlee Limerick will call to set up appt

## 2012-11-09 ENCOUNTER — Ambulatory Visit: Payer: 59 | Admitting: Family Medicine

## 2012-11-11 ENCOUNTER — Ambulatory Visit (INDEPENDENT_AMBULATORY_CARE_PROVIDER_SITE_OTHER): Payer: 59 | Admitting: General Surgery

## 2012-11-11 ENCOUNTER — Encounter: Payer: Self-pay | Admitting: General Surgery

## 2012-11-11 VITALS — BP 120/76 | HR 76 | Resp 14 | Ht 67.0 in | Wt 158.0 lb

## 2012-11-11 DIAGNOSIS — K8020 Calculus of gallbladder without cholecystitis without obstruction: Secondary | ICD-10-CM

## 2012-11-11 NOTE — Patient Instructions (Addendum)
Patient to have gallbladder surgery scheduled.   Laparoscopic Cholecystectomy Laparoscopic cholecystectomy is surgery to remove the gallbladder. The gallbladder is located slightly to the right of center in the abdomen, behind the liver. It is a concentrating and storage sac for the bile produced in the liver. Bile aids in the digestion and absorption of fats. Gallbladder disease (cholecystitis) is an inflammation of your gallbladder. This condition is usually caused by a buildup of gallstones (cholelithiasis) in your gallbladder. Gallstones can block the flow of bile, resulting in inflammation and pain. In severe cases, emergency surgery may be required. When emergency surgery is not required, you will have time to prepare for the procedure. Laparoscopic surgery is an alternative to open surgery. Laparoscopic surgery usually has a shorter recovery time. Your common bile duct may also need to be examined and explored. Your caregiver will discuss this with you if he or she feels this should be done. If stones are found in the common bile duct, they may be removed. LET YOUR CAREGIVER KNOW ABOUT:  Allergies to food or medicine.  Medicines taken, including vitamins, herbs, eyedrops, over-the-counter medicines, and creams.  Use of steroids (by mouth or creams).  Previous problems with anesthetics or numbing medicines.  History of bleeding problems or blood clots.  Previous surgery.  Other health problems, including diabetes and kidney problems.  Possibility of pregnancy, if this applies. RISKS AND COMPLICATIONS All surgery is associated with risks. Some problems that may occur following this procedure include:  Infection.  Damage to the common bile duct, nerves, arteries, veins, or other internal organs such as the stomach or intestines.  Bleeding.  A stone may remain in the common bile duct. BEFORE THE PROCEDURE  Do not take aspirin for 3 days prior to surgery or blood thinners for 1  week prior to surgery.  Do not eat or drink anything after midnight the night before surgery.  Let your caregiver know if you develop a cold or other infectious problem prior to surgery.  You should be present 60 minutes before the procedure or as directed. PROCEDURE  You will be given medicine that makes you sleep (general anesthetic). When you are asleep, your surgeon will make several small cuts (incisions) in your abdomen. One of these incisions is used to insert a small, lighted scope (laparoscope) into the abdomen. The laparoscope helps the surgeon see into your abdomen. Carbon dioxide gas will be pumped into your abdomen. The gas allows more room for the surgeon to perform your surgery. Other operating instruments are inserted through the other incisions. Laparoscopic procedures may not be appropriate when:  There is major scarring from previous surgery.  The gallbladder is extremely inflamed.  There are bleeding disorders or unexpected cirrhosis of the liver.  A pregnancy is near term.  Other conditions make the laparoscopic procedure impossible. If your surgeon feels it is not safe to continue with a laparoscopic procedure, he or she will perform an open abdominal procedure. In this case, the surgeon will make an incision to open the abdomen. This gives the surgeon a larger view and field to work within. This may allow the surgeon to perform procedures that sometimes cannot be performed with a laparoscope alone. Open surgery has a longer recovery time. AFTER THE PROCEDURE  You will be taken to the recovery area where a nurse will watch and check your progress.  You may be allowed to go home the same day.  Do not resume physical activities until directed by your caregiver.  You may resume a normal diet and activities as directed. Document Released: 04/21/2005 Document Revised: 07/14/2011 Document Reviewed: 10/04/2010 [facility] Patient Information 2014 [place],  [place].  Patient's surgery has been scheduled for 12-02-12 at [facility].

## 2012-11-11 NOTE — Progress Notes (Signed)
Patient ID: Nicole Shelton, female   DOB: April 21, 1966, 47 y.o.   MRN: [id number]  Chief Complaint  Patient presents with  . Abdominal Pain    gall bladder    HPI Nicole Shelton is a 47 y.o. female who presents for a gallbladder evaluation. She has abdominal pain 2 weeks ago. She states that eating makes it worse. She can't pin point any specific foods that make the pain worse. She does have diarrhea periodically. No complaints of pain today. She also mentions back pain and a sensation of her food not going down, and chest heaviness.   HPI  Past Medical History  Diagnosis Date  . History of chicken pox   . Seasonal allergies   . Hyperlipemia   . History of recurrent UTIs     Past Surgical History  Procedure Laterality Date  . Dilation and curettage of uterus      Family History  Problem Relation Age of Onset  . Hyperlipidemia Mother     on medication recently  . Pulmonary fibrosis Father     double lung transplant  . Arthritis Other   . Cancer Other     bladder  . Heart disease Other     Social History History  Substance Use Topics  . Smoking status: Never Smoker   . Smokeless tobacco: Never Used  . Alcohol Use: No    Allergies  Allergen Reactions  . Amoxicillin     REACTION: back, leg pain  . Cetirizine Hcl     REACTION: sedation  . Loratadine     REACTION: sedation    Current Outpatient Prescriptions  Medication Sig Dispense Refill  . omeprazole (PRILOSEC) 20 MG capsule Take 20 mg by mouth as needed.       No current facility-administered medications for this visit.    Review of Systems Review of Systems  Constitutional: Negative.   Respiratory: Negative.   Cardiovascular: Negative.   Gastrointestinal: Positive for abdominal pain.    Blood pressure 120/76, pulse 76, resp. rate 14, height 5\' 7"  (1.702 m), weight 158 lb (71.668 kg), last menstrual period 10/10/2012.  Physical Exam Physical Exam  Constitutional: She is oriented to  person, place, and time. She appears well-developed and well-nourished.  Neck: No thyromegaly present.  Cardiovascular: Normal rate, regular rhythm and normal heart sounds.   No murmur heard. Pulmonary/Chest: Effort normal and breath sounds normal.  Abdominal: Soft. Normal appearance and bowel sounds are normal. There is no hepatosplenomegaly. There is tenderness in the right upper quadrant. No hernia.  Neurological: She is alert and oriented to person, place, and time.  Skin: Skin is warm and dry.    Data Reviewed Ultrasound examination dated November 03, 2012 showed multiple stones. Common bile duct diameter 2.5 mm.  Assessment    Symptomatic cholelithiasis.     Plan    The retrosternal pain the patient is experiencing is likely related to the gallbladder. She has no risk factors for cardiac disease. Radiation of the pain to the subscapular area of the right would point to a biliary source. Options for management were reviewed: 1) observation versus 2) elective cholecystectomy.     Patient's surgery has been scheduled for 12-02-12 at [facility]. This is to accommodate patient's upcoming trip to the beach.    Earline Mayotte 11/12/2012, 6:50 PM

## 2012-11-12 ENCOUNTER — Encounter: Payer: Self-pay | Admitting: General Surgery

## 2012-11-12 ENCOUNTER — Other Ambulatory Visit: Payer: Self-pay | Admitting: General Surgery

## 2012-11-12 DIAGNOSIS — K8020 Calculus of gallbladder without cholecystitis without obstruction: Secondary | ICD-10-CM

## 2012-12-02 ENCOUNTER — Ambulatory Visit: Payer: Self-pay | Admitting: General Surgery

## 2012-12-02 DIAGNOSIS — K8010 Calculus of gallbladder with chronic cholecystitis without obstruction: Secondary | ICD-10-CM

## 2012-12-03 ENCOUNTER — Encounter: Payer: Self-pay | Admitting: General Surgery

## 2012-12-03 LAB — PATHOLOGY REPORT

## 2012-12-06 ENCOUNTER — Encounter: Payer: Self-pay | Admitting: General Surgery

## 2012-12-13 ENCOUNTER — Encounter: Payer: Self-pay | Admitting: General Surgery

## 2012-12-13 ENCOUNTER — Ambulatory Visit (INDEPENDENT_AMBULATORY_CARE_PROVIDER_SITE_OTHER): Payer: 59 | Admitting: General Surgery

## 2012-12-13 VITALS — BP 120/74 | HR 76 | Resp 12 | Ht 67.0 in | Wt 160.0 lb

## 2012-12-13 DIAGNOSIS — K8020 Calculus of gallbladder without cholecystitis without obstruction: Secondary | ICD-10-CM

## 2012-12-13 NOTE — Progress Notes (Signed)
Patient ID: Nicole Shelton, female   DOB: [birth year], 47 y.o.   MRN: [id number] Here for Post Op gallbladder surgery done on 12/02/12. Patient states that she is doing well. She complains of gas after meals. This at times will be somewhat uncomfortable and is usually followed by a loose stool. She has not appreciated any clear dietary pattern.   Lungs sounds clear.  Cardio: Regular rhythm.  Abdomen: Soft, nontender. Port sites healing without evidence of infection or inflammation.    Pathology: Chronic cholecystitis and cholelithiasis.  Impression: Doing well status post cholecystectomy. Post cholecystectomy "gas" and loose stool should improve.  Plan: She'll increase her activity as tolerated. We'll plan for followup exam if needed.

## 2012-12-13 NOTE — Patient Instructions (Addendum)
Activity as tolerated

## 2013-03-10 ENCOUNTER — Other Ambulatory Visit: Payer: Self-pay

## 2013-06-06 ENCOUNTER — Telehealth: Payer: Self-pay | Admitting: Obstetrics and Gynecology

## 2013-06-06 NOTE — Telephone Encounter (Signed)
I agree with your advice to patient.  She will need to be seen for [place] to send her for a referral. If she has ever seen a gastroenterologist, she may contact that person directly to see if they will see her. Otherwise, we can address this at her annual exam, if she declines a visit sooner.

## 2013-06-06 NOTE — Telephone Encounter (Signed)
Patient wants to speak with nurse about a referral to a gastroenterologist for problems she has been having with her stomach. She stated she has had her gall bladder removed and thinks it may be related to that.

## 2013-06-06 NOTE — Telephone Encounter (Signed)
Encounter closed

## 2013-06-06 NOTE — Telephone Encounter (Signed)
Call to patient, last seen for AEX with Dr Joan Flores 06-18-12.  Scheduled with Dr Quincy Simmonds 06-20-13. See pre-EPIC chart.  Patient reports problems with very painful gas.  All over abdomen and even up into shoulder. Can double her over. Reports she saw PCP last summer and had to have cholecystectomy but problems have persisted and she thinks she needs specialist.  Has not talked to PCP about this. Advised we would need to see her for this before referring. Oferred to see her for problem visit sooner if desires. Patient declines. She wants to be sure she can address this at AEX since at PCP can only be seen for one problem. Advised there may be additional charge not covered by AEX but can be discussed at AEX. Advised we frequently recommend Dr Collene Mares and she is welcome to call and see if they will schedule her without a referral but we would need to see her before we can call. Advised will review with Dr Quincy Simmonds.  Agree?

## 2013-06-16 ENCOUNTER — Encounter: Payer: Self-pay | Admitting: Obstetrics and Gynecology

## 2013-06-20 ENCOUNTER — Ambulatory Visit: Payer: Self-pay | Admitting: Obstetrics and Gynecology

## 2013-06-20 ENCOUNTER — Encounter: Payer: Self-pay | Admitting: Obstetrics and Gynecology

## 2013-06-20 ENCOUNTER — Ambulatory Visit (INDEPENDENT_AMBULATORY_CARE_PROVIDER_SITE_OTHER): Payer: 59 | Admitting: Obstetrics and Gynecology

## 2013-06-20 VITALS — BP 100/64 | HR 68 | Resp 20 | Ht 67.0 in | Wt 160.0 lb

## 2013-06-20 DIAGNOSIS — Z0000 Encounter for general adult medical examination without abnormal findings: Secondary | ICD-10-CM

## 2013-06-20 DIAGNOSIS — R82998 Other abnormal findings in urine: Secondary | ICD-10-CM

## 2013-06-20 DIAGNOSIS — R109 Unspecified abdominal pain: Secondary | ICD-10-CM

## 2013-06-20 DIAGNOSIS — Z01419 Encounter for gynecological examination (general) (routine) without abnormal findings: Secondary | ICD-10-CM

## 2013-06-20 DIAGNOSIS — R8281 Pyuria: Secondary | ICD-10-CM

## 2013-06-20 LAB — POCT URINALYSIS DIPSTICK
Bilirubin, UA: NEGATIVE
Blood, UA: NEGATIVE
Glucose, UA: NEGATIVE
KETONES UA: NEGATIVE
Nitrite, UA: NEGATIVE
PH UA: 5
PROTEIN UA: NEGATIVE
Urobilinogen, UA: NEGATIVE

## 2013-06-20 MED ORDER — METRONIDAZOLE 500 MG PO TABS: 500.0000 mg | ORAL_TABLET | Freq: Two times a day (BID) | ORAL | Status: DC

## 2013-06-20 NOTE — Progress Notes (Signed)
Patient ID: Nicole Shelton, female   DOB: 10-Jul-1965, 48 y.o.   MRN: [id number] GYNECOLOGY VISIT  PCP:   Loura Pardon, MD  Referring provider:   HPI: 48 y.o.   Married  Caucasian  female   [id number] with Patient's last menstrual period was 06/02/2013.   here for   AEX.  Notes a discharge from vagina all the time.  Clear.  Sometimes has itching and burning.  No odor. Can burn after intercourse.  Denies dryness.   Wearing a panty liner all the time.  No new exposures.  No over the counter treatment.  No new partners.   Leaks with sneeze, cough, exercise. No leakage with normal activity.   Diarrhea since had gall bladder removed.  Having gas pains increasing since had gall bladder surgery. Abdominal pain and gallstones was the reason for the cholecystectomy.   Menstruation monthly.   Hgb:    13.7 Urine:  1+ WBC's (asymptomatic).  Has one UTI per year.  Last year had 2 UTIs.   GYNECOLOGIC HISTORY: Patient's last menstrual period was 06/02/2013. Sexually active:  yes Partner preference: female Contraception:   vasectomy Menopausal hormone therapy: n/a DES exposure:  no  Blood transfusions:   no Sexually transmitted diseases:   no GYN Procedures:  none Mammogram:   05-31-13 [id number]              Pap:   06-22-12 wnl:neg HPV History of abnormal pap smear:  no   OB History   Grav Para Term Preterm Abortions TAB SAB Ect Mult Living   2 1 1  1  1   1        LIFESTYLE: Exercise:   no            Tobacco:   no Alcohol:     no Drug use:  no  OTHER HEALTH MAINTENANCE: Tetanus/TDap:   2013 Gardisil:              n/a Influenza:            02/2013 Zostavax:            n/a  Bone density:     n/a Colonoscopy:     n/a  Cholesterol check:     2013--borderline  Family History  Problem Relation Age of Onset  . Hyperlipidemia Mother     on medication recently  . Pulmonary fibrosis Father     double lung transplant  . Arthritis Other   . Cancer Other     bladder  .  Heart disease Other   . Cancer Maternal Grandmother     Bladder Cancer  . Osteoarthritis Paternal Grandmother   . Osteoarthritis Paternal Grandfather     Patient Active Problem List   Diagnosis Date Noted  . Gallstones 11/03/2012  . Abdominal pain 11/01/2012  . Contusion of left great toe without damage to nail 10/15/2012  . Contusion of right great toe without damage to nail 10/15/2012  . Routine general medical examination at a health care facility 10/06/2011  . HYPERLIPIDEMIA 04/11/2008  . IRRITABLE BOWEL SYNDROME 04/11/2008   Past Medical History  Diagnosis Date  . History of chicken pox   . Seasonal allergies   . Hyperlipemia   . History of recurrent UTIs   . IBS (irritable bowel syndrome)   . Urinary incontinence     with laughing,coughing,sneezing    Past Surgical History  Procedure Laterality Date  . Dilation and curettage of uterus    . Cholecystectomy  2014  . Mandible surgery      ALLERGIES: Amoxicillin; Cetirizine hcl; and Loratadine  Current Outpatient Prescriptions  Medication Sig Dispense Refill  . Ibuprofen (ADVIL PO) Take by mouth as needed.       No current facility-administered medications for this visit.     ROS:  Pertinent items are noted in HPI.  SOCIAL HISTORY:  Married.   PHYSICAL EXAMINATION:    BP 100/64  Pulse 68  Resp 20  Ht 5\' 7"  (1.702 m)  Wt 160 lb (72.576 kg)  BMI 25.05 kg/m2  LMP 06/02/2013   Wt Readings from Last 3 Encounters:  06/20/13 160 lb (72.576 kg)  12/13/12 160 lb (72.576 kg)  11/11/12 158 lb (71.668 kg)     Ht Readings from Last 3 Encounters:  06/20/13 5\' 7"  (1.702 m)  12/13/12 5\' 7"  (1.702 m)  11/11/12 5\' 7"  (1.702 m)    General appearance: alert, cooperative and appears stated age Head: Normocephalic, without obvious abnormality, atraumatic Neck: no adenopathy, supple, symmetrical, trachea midline and thyroid not enlarged, symmetric, no tenderness/mass/nodules Lungs: clear to auscultation  bilaterally Breasts: Inspection negative, No nipple retraction or dimpling, No nipple discharge or bleeding, No axillary or supraclavicular adenopathy, Normal to palpation without dominant masses Heart: regular rate and rhythm Abdomen: soft, non-tender; no masses,  no organomegaly Extremities: extremities normal, atraumatic, no cyanosis or edema Skin: Skin color, texture, turgor normal. No rashes or lesions Lymph nodes: Cervical, supraclavicular, and axillary nodes normal. No abnormal inguinal nodes palpated Neurologic: Grossly normal  Pelvic: External genitalia:  no lesions              Urethra:  normal appearing urethra with no masses, tenderness or lesions              Bartholins and Skenes: normal                 Vagina: normal appearing vagina with normal color and discharge, no lesions              Cervix: normal appearance              Pap and high risk HPV testing done: no.            Bimanual Exam:  Uterus:  uterus is normal size, shape, consistency and nontender                                      Adnexa: normal adnexa in size, nontender and no masses                                      Rectovaginal: Confirms                                      Anus:  normal sphincter tone, no lesions  Wet prep - pH 5.5, negative yeast, positive clue cells, negative trichomonas.   ASSESSMENT  Normal gynecologic exam. Abdominal pain.  Status post cholecystectomy for cholelithiasis. Pyuria.  Bacterial vaginosis.  Genuine stress incontinence.   PLAN  Mammogram age 48.  Pap smear and high risk HPV testing in 2019. Urine culture.   Referral to GI - Dr. Collene Mares.  Flagyl 500 mg po bid for one week.  Medications  per Epic orders Discussion with patient about genuine stress incontinence.  Discussed physical therapy and surgical treatment.  ACOG handout to patient.  Return annually or prn   An After Visit Summary was printed and given to the patient.

## 2013-06-20 NOTE — Patient Instructions (Signed)
Bacterial Vaginosis Bacterial vaginosis is a vaginal infection that occurs when the normal balance of bacteria in the vagina is disrupted. It results from an overgrowth of certain bacteria. This is the most common vaginal infection in women of childbearing age. Treatment is important to prevent complications, especially in pregnant women, as it can cause a premature delivery. CAUSES  Bacterial vaginosis is caused by an increase in harmful bacteria that are normally present in smaller amounts in the vagina. Several different kinds of bacteria can cause bacterial vaginosis. However, the reason that the condition develops is not fully understood. RISK FACTORS Certain activities or behaviors can put you at an increased risk of developing bacterial vaginosis, including:  Having a new sex partner or multiple sex partners.  Douching.  Using an intrauterine device (IUD) for contraception. Women do not get bacterial vaginosis from toilet seats, bedding, swimming pools, or contact with objects around them. SIGNS AND SYMPTOMS  Some women with bacterial vaginosis have no signs or symptoms. Common symptoms include:  Grey vaginal discharge.  A fishlike odor with discharge, especially after sexual intercourse.  Itching or burning of the vagina and vulva.  Burning or pain with urination. DIAGNOSIS  Your health care provider will take a medical history and examine the vagina for signs of bacterial vaginosis. A sample of vaginal fluid may be taken. Your health care provider will look at this sample under a microscope to check for bacteria and abnormal cells. A vaginal pH test may also be done.  TREATMENT  Bacterial vaginosis may be treated with antibiotic medicines. These may be given in the form of a pill or a vaginal cream. A second round of antibiotics may be prescribed if the condition comes back after treatment.  HOME CARE INSTRUCTIONS   Only take over-the-counter or prescription medicines as  directed by your health care provider.  If antibiotic medicine was prescribed, take it as directed. Make sure you finish it even if you start to feel better.  Do not have sex until treatment is completed.  Tell all sexual partners that you have a vaginal infection. They should see their health care provider and be treated if they have problems, such as a mild rash or itching.  Practice safe sex by using condoms and only having one sex partner. SEEK MEDICAL CARE IF:   Your symptoms are not improving after 3 days of treatment.  You have increased discharge or pain.  You have a fever. MAKE SURE YOU:   Understand these instructions.  Will watch your condition.  Will get help right away if you are not doing well or get worse. FOR MORE INFORMATION  Centers for Disease Control and Prevention, Division of STD Prevention: [employer]raiser[employer]raud[employer] American Sexual Health Association (ASHA): www.ashastd.org  Document Released: 04/21/2005 Document Revised: 02/09/2013 Document Reviewed: 12/01/2012 [facility] Patient Information 2014 [place].  EXERCISE AND DIET:  We recommended that you start or continue a regular exercise program for good health. Regular exercise means any activity that makes your heart beat faster and makes you sweat.  We recommend exercising at least 30 minutes per day at least 3 days a week, preferably 4 or 5.  We also recommend a diet low in fat and sugar.  Inactivity, poor dietary choices and obesity can cause diabetes, heart attack, stroke, and kidney damage, among others.    ALCOHOL AND SMOKING:  Women should limit their alcohol intake to no more than 7 drinks/beers/glasses of wine (combined, not each!) per week. Moderation of alcohol intake to  this level decreases your risk of breast cancer and liver damage. And of course, no recreational drugs are part of a healthy lifestyle.  And absolutely no smoking or even second hand smoke. Most people know smoking can cause heart and  lung diseases, but did you know it also contributes to weakening of your bones? Aging of your skin?  Yellowing of your teeth and nails?  CALCIUM AND VITAMIN D:  Adequate intake of calcium and Vitamin D are recommended.  The recommendations for exact amounts of these supplements seem to change often, but generally speaking 600 mg of calcium (either carbonate or citrate) and 800 units of Vitamin D per day seems prudent. Certain women may benefit from higher intake of Vitamin D.  If you are among these women, your doctor will have told you during your visit.    PAP SMEARS:  Pap smears, to check for cervical cancer or precancers,  have traditionally been done yearly, although recent scientific advances have shown that most women can have pap smears less often.  However, every woman still should have a physical exam from her gynecologist every year. It will include a breast check, inspection of the vulva and vagina to check for abnormal growths or skin changes, a visual exam of the cervix, and then an exam to evaluate the size and shape of the uterus and ovaries.  And after 46 years of age, a rectal exam is indicated to check for rectal cancers. We will also provide age appropriate advice regarding health maintenance, like when you should have certain vaccines, screening for sexually transmitted diseases, bone density testing, colonoscopy, mammograms, etc.   MAMMOGRAMS:  All women over 87 years old should have a yearly mammogram. Many facilities now offer a "3D" mammogram, which may cost around $50 extra out of pocket. If possible,  we recommend you accept the option to have the 3D mammogram performed.  It both reduces the number of women who will be called back for extra views which then turn out to be normal, and it is better than the routine mammogram at detecting truly abnormal areas.    COLONOSCOPY:  Colonoscopy to screen for colon cancer is recommended for all women at age 29.  We know, you hate the idea of  the prep.  We agree, BUT, having colon cancer and not knowing it is worse!!  Colon cancer so often starts as a polyp that can be seen and removed at colonscopy, which can quite literally save your life!  And if your first colonoscopy is normal and you have no family history of colon cancer, most women don't have to have it again for 10 years.  Once every ten years, you can do something that may end up saving your life, right?  We will be happy to help you get it scheduled when you are ready.  Be sure to check your insurance coverage so you understand how much it will cost.  It may be covered as a preventative service at no cost, but you should check your particular policy.

## 2013-06-21 LAB — HEMOGLOBIN, FINGERSTICK: HEMOGLOBIN, FINGERSTICK: 13.7 g/dL (ref 12.0–16.0)

## 2013-06-22 ENCOUNTER — Telehealth: Payer: Self-pay | Admitting: Obstetrics and Gynecology

## 2013-06-22 LAB — CULTURE, URINE COMPREHENSIVE
Colony Count: NO GROWTH
Organism ID, Bacteria: NO GROWTH

## 2013-06-22 NOTE — Telephone Encounter (Signed)
Per fax received. Patient scheduled w/ Dr.Mann 02.26.2015 @0830 . Patient aware

## 2013-08-15 ENCOUNTER — Encounter: Payer: Self-pay | Admitting: Family Medicine

## 2013-08-15 ENCOUNTER — Ambulatory Visit (INDEPENDENT_AMBULATORY_CARE_PROVIDER_SITE_OTHER): Payer: 59 | Admitting: Family Medicine

## 2013-08-15 VITALS — BP 122/70 | HR 66 | Temp 98.0°F | Ht 66.5 in | Wt 159.8 lb

## 2013-08-15 DIAGNOSIS — K589 Irritable bowel syndrome without diarrhea: Secondary | ICD-10-CM

## 2013-08-15 DIAGNOSIS — R109 Unspecified abdominal pain: Secondary | ICD-10-CM

## 2013-08-15 NOTE — Progress Notes (Signed)
Pre visit review using our clinic review tool, if applicable. No additional management support is needed unless otherwise documented below in the visit note. 

## 2013-08-15 NOTE — Assessment & Plan Note (Signed)
Persistent. ?IBS- advised probiotic, dietary changes, gas x/beno and pepcid. She refuses further work up- prefers to see GI first. See AVS- referral placed.

## 2013-08-15 NOTE — Patient Instructions (Signed)
Increase fiber and water, and try over the counter gas-x (four times a day when necessary) or beano for bloating.   pepcid 20 mg daily for next 2 weeks. Exclude gas producing foods (beans, onions, celery, carrots, raisins, bananas, apricots, prunes, brussel sprouts, wheat germ, pretzels)  Consider trail of lactose free diet (back off milk)   Trial of align (probiotic) for bloating/IBS symptoms (OTC).

## 2013-08-15 NOTE — Progress Notes (Signed)
Subjective:   Patient ID: Nicole Shelton, female    DOB: 02-24-1966, 48 y.o.   MRN: [id number]  Nicole Shelton is a pleasant 48 y.o. year old female patient of Dr. Glori Bickers, new to me,  who presents to clinic today with Abdominal Pain  on 08/15/2013  HPI: H/o cholecystectomy in 11/2012 (Dr. Bary Castilla).  She had this done because of the symptoms she is still having.  Cholecystectomy did not help relieve these symptoms.  Intermittent "all over abdominal pain," tends to be sharp.  Also feels bloated when this happens.  Occuring at least weekly now.  + loose stools.  No black stools.  No blood or mucous in stools.  No nausea or vomiting.  No family h/o IBD or other colon issues she is aware of.  Has had symptoms of GERD in past and she feels this is different.  Has not taken much for it.  She is asking for GI referral today.  Has never had an endoscopy or colonoscopy. Patient Active Problem List   Diagnosis Date Noted  . Gallstones 11/03/2012  . Abdominal pain 11/01/2012  . Contusion of left great toe without damage to nail 10/15/2012  . Contusion of right great toe without damage to nail 10/15/2012  . Routine general medical examination at a health care facility 10/06/2011  . HYPERLIPIDEMIA 04/11/2008  . IRRITABLE BOWEL SYNDROME 04/11/2008   Past Medical History  Diagnosis Date  . History of chicken pox   . Seasonal allergies   . Hyperlipemia   . History of recurrent UTIs   . IBS (irritable bowel syndrome)   . Urinary incontinence     with laughing,coughing,sneezing   Past Surgical History  Procedure Laterality Date  . Dilation and curettage of uterus    . Cholecystectomy  2014  . Mandible surgery     History  Substance Use Topics  . Smoking status: Never Smoker   . Smokeless tobacco: Never Used  . Alcohol Use: No   Family History  Problem Relation Age of Onset  . Hyperlipidemia Mother     on medication recently  . Pulmonary fibrosis Father     double lung  transplant  . Arthritis Other   . Cancer Other     bladder  . Heart disease Other   . Cancer Maternal Grandmother     Bladder Cancer  . Osteoarthritis Paternal Grandmother   . Osteoarthritis Paternal Grandfather    Allergies  Allergen Reactions  . Amoxicillin     REACTION: back, leg pain  . Cetirizine Hcl     REACTION: sedation  . Loratadine     REACTION: sedation   Current Outpatient Prescriptions on File Prior to Visit  Medication Sig Dispense Refill  . Ibuprofen (ADVIL PO) Take by mouth as needed.       No current facility-administered medications on file prior to visit.   The PMH, PSH, Social History, Family History, Medications, and allergies have been reviewed in [facility], and have been updated if relevant.     Review of Systems    See HPI No fevers Objective:    BP 122/70  Pulse 66  Temp(Src) 98 F (36.7 C) (Oral)  Ht 5' 6.5" (1.689 m)  Wt 159 lb 12 oz (72.462 kg)  BMI 25.40 kg/m2  SpO2 100%  LMP 08/01/2013   Physical Exam  Nursing note and vitals reviewed. Constitutional: She appears well-developed and well-nourished. No distress.  HENT:  Head: Normocephalic.  Abdominal: Soft. Bowel sounds are normal.  She exhibits no distension and no mass. There is no tenderness. There is no rebound and no guarding.  Skin: Skin is warm and dry.  Psychiatric: She has a normal mood and affect. Her behavior is normal. Judgment and thought content normal.          Assessment & Plan:   No diagnosis found. No Follow-up on file.

## 2013-09-16 ENCOUNTER — Encounter: Payer: Self-pay | Admitting: *Deleted

## 2013-09-22 ENCOUNTER — Telehealth: Payer: Self-pay | Admitting: Internal Medicine

## 2013-09-23 NOTE — Telephone Encounter (Signed)
Can be scheduled in new pt slot

## 2013-09-23 NOTE — Telephone Encounter (Signed)
Dr. Hilarie Fredrickson please review the records,  Will you accept?

## 2013-09-27 ENCOUNTER — Encounter: Payer: Self-pay | Admitting: Internal Medicine

## 2013-09-27 NOTE — Telephone Encounter (Signed)
[place] c/b to schedule pt. Pt scheduled for 11-24-13.

## 2013-11-24 ENCOUNTER — Encounter: Payer: Self-pay | Admitting: Internal Medicine

## 2013-11-24 ENCOUNTER — Ambulatory Visit (INDEPENDENT_AMBULATORY_CARE_PROVIDER_SITE_OTHER): Payer: 59 | Admitting: Internal Medicine

## 2013-11-24 ENCOUNTER — Other Ambulatory Visit (INDEPENDENT_AMBULATORY_CARE_PROVIDER_SITE_OTHER): Payer: 59

## 2013-11-24 VITALS — BP 104/80 | HR 76 | Ht 66.5 in | Wt 162.2 lb

## 2013-11-24 DIAGNOSIS — R140 Abdominal distension (gaseous): Secondary | ICD-10-CM

## 2013-11-24 DIAGNOSIS — R109 Unspecified abdominal pain: Secondary | ICD-10-CM

## 2013-11-24 DIAGNOSIS — R748 Abnormal levels of other serum enzymes: Secondary | ICD-10-CM

## 2013-11-24 DIAGNOSIS — R141 Gas pain: Secondary | ICD-10-CM

## 2013-11-24 DIAGNOSIS — R143 Flatulence: Secondary | ICD-10-CM

## 2013-11-24 DIAGNOSIS — R194 Change in bowel habit: Secondary | ICD-10-CM

## 2013-11-24 DIAGNOSIS — R1010 Upper abdominal pain, unspecified: Secondary | ICD-10-CM

## 2013-11-24 DIAGNOSIS — R1030 Lower abdominal pain, unspecified: Secondary | ICD-10-CM

## 2013-11-24 DIAGNOSIS — R198 Other specified symptoms and signs involving the digestive system and abdomen: Secondary | ICD-10-CM

## 2013-11-24 DIAGNOSIS — K589 Irritable bowel syndrome without diarrhea: Secondary | ICD-10-CM

## 2013-11-24 DIAGNOSIS — R142 Eructation: Secondary | ICD-10-CM

## 2013-11-24 LAB — HEPATIC FUNCTION PANEL
ALT: 21 U/L (ref 0–35)
AST: 25 U/L (ref 0–37)
Albumin: 4 g/dL (ref 3.5–5.2)
Alkaline Phosphatase: 45 U/L (ref 39–117)
BILIRUBIN DIRECT: 0.1 mg/dL (ref 0.0–0.3)
Total Bilirubin: 0.8 mg/dL (ref 0.2–1.2)
Total Protein: 8 g/dL (ref 6.0–8.3)

## 2013-11-24 MED ORDER — DICYCLOMINE HCL 20 MG PO TABS: 20.0000 mg | ORAL_TABLET | Freq: Three times a day (TID) | ORAL | Status: DC | PRN

## 2013-11-24 MED ORDER — PEG-KCL-NACL-NASULF-NA ASC-C 100 G PO SOLR: 1.0000 | Freq: Once | ORAL | Status: DC

## 2013-11-24 NOTE — Progress Notes (Signed)
Patient ID: Nicole Shelton, female   DOB: 28-Mar-1966, 48 y.o.   MRN: [id number] HPI: Nicole Shelton is a 48 year old female with a past medical history of IBS, GERD, gallstones status post cholecystectomy in July of 2014 who is seen in consultation at the request of Dr. Glori Bickers to evaluate abdominal pain and bloating. She is here alone today. She was previously seen in GI by Dr. Collene Mares on one occasion in March 2015. She was diagnosed with IBS and GERD. The patient wasn't satisfied with the appointment and doesn't feel like she got an explanation of her symptoms. She reports her symptoms have been present for approximately one year. She has good and bad periods. She has diffuse abdominal tenderness and pain which is associated with gas and bloating. This can radiate to her chest bilaterally near her clavicles. This doesn't occur every single day and she has more good and bad weeks. No trigger doesn't always seem to relate to eating. She does report lower abdominal cramping pain which can be severe causing her to "double over" and makes it hard for her to work. Bowel movements seem random and that she can have more formed daily stool with other days having loose stools and then she could not have a bowel movement for several days. She reports a good appetite without weight loss. She does have some occasional mild heartburn but no regurgitation. No dysphagia or odynophagia. Stress seems to worsen her symptoms. She is taking omeprazole 20 mg per day but she's not sure it helps. She's also been taking a probiotic in the form of align for 3 months, though she's not sure this is helped much. Similar symptoms led to an abdominal ultrasound which revealed gallstones and she had cholecystectomy in July 2014. Symptoms did not change much after gallbladder surgery. She reports a family history of IBS in her mother but denies a known history of colorectal cancer, other GI tract malignancy or IBD. She is the mother of one  son and works as an Teacher, adult education work for [employer]  Past Medical History  Diagnosis Date  . History of chicken pox   . Seasonal allergies   . Hyperlipemia   . History of recurrent UTIs   . IBS (irritable bowel syndrome)   . Urinary incontinence     with laughing,coughing,sneezing  . GERD (gastroesophageal reflux disease)   . Gallstones 2014    Past Surgical History  Procedure Laterality Date  . Dilation and curettage of uterus    . Cholecystectomy  2014  . Mandible surgery      Outpatient Prescriptions Prior to Visit  Medication Sig Dispense Refill  . Ibuprofen (ADVIL PO) Take by mouth as needed.       No facility-administered medications prior to visit.    Allergies  Allergen Reactions  . Amoxicillin     REACTION: back, leg pain  . Cetirizine Hcl     REACTION: sedation  . Claritin [Loratadine]     REACTION: sedation  . Zyrtec Allergy [Cetirizine Hcl]     Family History  Problem Relation Age of Onset  . Hyperlipidemia Mother     on medication recently  . Pulmonary fibrosis Father     double lung transplant  . Heart disease Maternal Grandfather   . Bladder Cancer Maternal Grandmother   . Osteoarthritis Paternal Grandmother   . Osteoarthritis Paternal Grandfather   . Irritable bowel syndrome Mother     History  Substance Use Topics  . Smoking status: Never  Smoker   . Smokeless tobacco: Never Used  . Alcohol Use: No    ROS: As per history of present illness, otherwise negative  BP 104/80  Pulse 76  Ht 5' 6.5" (1.689 m)  Wt 162 lb 4 oz (73.596 kg)  BMI 25.80 kg/m2  LMP 11/21/2013 Constitutional: Well-developed and well-nourished. No distress. HEENT: Normocephalic and atraumatic. Oropharynx is clear and moist. No oropharyngeal exudate. Conjunctivae are normal.  No scleral icterus. Neck: Neck supple. Trachea midline. Cardiovascular: Normal rate, regular rhythm and intact distal pulses. No M/R/G Pulmonary/chest: Effort normal and  breath sounds normal. No wheezing, rales or rhonchi. Abdominal: Soft, diffuse tenderness without rebound or guarding, nondistended. Bowel sounds active throughout. There are no masses palpable. No hepatosplenomegaly. Extremities: no clubbing, cyanosis, or edema Lymphadenopathy: No cervical adenopathy noted. Neurological: Alert and oriented to person place and time. Skin: Skin is warm and dry. No rashes noted. Psychiatric: Normal mood and affect. Behavior is normal.  RELEVANT LABS AND IMAGING: CBC    Component Value Date/Time   WBC 4.4* 11/01/2012 0954   RBC 4.41 11/01/2012 0954   HGB 13.1 11/01/2012 0954   HCT 38.9 11/01/2012 0954   PLT 281.0 11/01/2012 0954   MCV 88.1 11/01/2012 0954   MCHC 33.8 11/01/2012 0954   RDW 12.7 11/01/2012 0954   LYMPHSABS 1.1 11/01/2012 0954   MONOABS 0.4 11/01/2012 0954   EOSABS 0.0 11/01/2012 0954   BASOSABS 0.0 11/01/2012 0954    CMP     Component Value Date/Time   NA 137 11/01/2012 0954   K 4.2 11/01/2012 0954   CL 109 11/01/2012 0954   CO2 20 11/01/2012 0954   GLUCOSE 72 11/01/2012 0954   BUN 13 11/01/2012 0954   CREATININE 0.6 11/01/2012 0954   CALCIUM 8.8 11/01/2012 0954   PROT 8.0 11/24/2013 1032   ALBUMIN 4.0 11/24/2013 1032   AST 25 11/24/2013 1032   ALT 21 11/24/2013 1032   ALKPHOS 45 11/24/2013 1032   BILITOT 0.8 11/24/2013 1032   GFRNONAA 99 05/15/2008 0951   GFRAA 119 05/15/2008 0951   Consult note from Dr. Collene Mares reviewed dated 07/14/2013. Labs included unremarkable CBC except for mild leukopenia at 3.5, metabolic panel normal, liver profile AST slightly elevated at 42 ALT slightly elevated at 37, remainder of liver function panel was normal. Normal lipase, TSH and negative celiac panel  Abdominal ultrasound and IOC reviewed.  Liver was normal with no evidence for steatosis, IOC was negative  ASSESSMENT/PLAN:  48 year old female with a past medical history of IBS, GERD, gallstones status post cholecystectomy in July of 2014 who is seen in consultation  at the request of Dr. Glori Bickers to evaluate abdominal pain and bloating.   1. Abd pain/bloating/alternating bowel habits -- her symptoms do seem irritable in nature. She is concerned and worries that something more is wrong. Labs have been unrevealing except for mild transaminitis in March 2015. I repeated liver enzymes today which is unremarkable. Celiac panel was negative. Given her ongoing trouble with abdominal pain, I recommended upper endoscopy and colonoscopy. If this is normal I think it will be very reassuring for her. This will rule out H. pylori, celiac disease, and IBD. I have recommended the FODMAP diet, and will prescribe Bentyl 20 mg 3 times daily as needed for abdominal cramping pain. I will also discontinue her omeprazole as it has not been helpful.  2. Elevated serum transaminases -- they have normalized, no further workup planned at present  Further recommendations after procedures

## 2013-11-24 NOTE — Patient Instructions (Signed)
Your physician has requested that you go to the basement for the following lab work before leaving today: LFT's.  We have sent the following medications to your pharmacy for you to pick up at your convenience: Bentyl.  Stop taking your Prilosec.  You have been given a Fodmap diet to follow.  You have been scheduled for an endoscopy and colonoscopy. Please follow the written instructions given to you at your visit today. Please pick up your prep at the pharmacy within the next 1-3 days. If you use inhalers (even only as needed), please bring them with you on the day of your procedure. Your physician has requested that you go to www.startemmi.com and enter the access code given to you at your visit today. This web site gives a general overview about your procedure. However, you should still follow specific instructions given to you by our office regarding your preparation for the procedure.  cc: Loura Pardon, MD

## 2013-11-28 ENCOUNTER — Encounter: Payer: Self-pay | Admitting: Internal Medicine

## 2014-01-03 ENCOUNTER — Encounter: Payer: Self-pay | Admitting: Internal Medicine

## 2014-01-03 ENCOUNTER — Ambulatory Visit (AMBULATORY_SURGERY_CENTER): Payer: 59 | Admitting: Internal Medicine

## 2014-01-03 VITALS — BP 104/57 | HR 63 | Temp 98.4°F | Resp 12

## 2014-01-03 DIAGNOSIS — K2970 Gastritis, unspecified, without bleeding: Secondary | ICD-10-CM

## 2014-01-03 DIAGNOSIS — R140 Abdominal distension (gaseous): Secondary | ICD-10-CM

## 2014-01-03 DIAGNOSIS — R109 Unspecified abdominal pain: Secondary | ICD-10-CM

## 2014-01-03 DIAGNOSIS — R141 Gas pain: Secondary | ICD-10-CM

## 2014-01-03 DIAGNOSIS — R143 Flatulence: Secondary | ICD-10-CM

## 2014-01-03 DIAGNOSIS — K589 Irritable bowel syndrome without diarrhea: Secondary | ICD-10-CM

## 2014-01-03 DIAGNOSIS — R142 Eructation: Secondary | ICD-10-CM

## 2014-01-03 DIAGNOSIS — D126 Benign neoplasm of colon, unspecified: Secondary | ICD-10-CM

## 2014-01-03 DIAGNOSIS — D123 Benign neoplasm of transverse colon: Secondary | ICD-10-CM

## 2014-01-03 DIAGNOSIS — R1084 Generalized abdominal pain: Secondary | ICD-10-CM

## 2014-01-03 DIAGNOSIS — R198 Other specified symptoms and signs involving the digestive system and abdomen: Secondary | ICD-10-CM

## 2014-01-03 DIAGNOSIS — D1330 Benign neoplasm of unspecified part of small intestine: Secondary | ICD-10-CM

## 2014-01-03 DIAGNOSIS — K2990 Gastroduodenitis, unspecified, without bleeding: Secondary | ICD-10-CM

## 2014-01-03 HISTORY — PX: COLONOSCOPY: SHX174

## 2014-01-03 MED ORDER — SODIUM CHLORIDE 0.9 % IV SOLN: 500.0000 mL | INTRAVENOUS | Status: DC

## 2014-01-03 MED ORDER — RIFAXIMIN 550 MG PO TABS: 550.0000 mg | ORAL_TABLET | Freq: Three times a day (TID) | ORAL | Status: DC

## 2014-01-03 NOTE — Op Note (Signed)
[facility]  [address] [place], [address]   ENDOSCOPY PROCEDURE REPORT  PATIENT: Nicole, Nicole Shelton  MR#: [id number] BIRTHDATE: 1966-01-17 , 47  yrs. old GENDER: Female ENDOSCOPIST: Jerene Bears, MD REFERRED BY:  Abner Greenspan, M.D. PROCEDURE DATE:  01/03/2014 PROCEDURE:  EGD w/ biopsy ASA CLASS:     Class II INDICATIONS:  abdominal pain. MEDICATIONS: MAC sedation, administered by CRNA and propofol (Diprivan) 200mg  IV TOPICAL ANESTHETIC: none  DESCRIPTION OF PROCEDURE: After the risks benefits and alternatives of the procedure were thoroughly explained, informed consent was obtained.  The LB [id number] endoscope was introduced through the mouth and advanced to the second portion of the duodenum. Without limitations.  The instrument was slowly withdrawn as the mucosa was fully examined.  ESOPHAGUS: The mucosa of the esophagus appeared normal.   A 2 cm hiatal hernia was noted.  STOMACH: The mucosa of the stomach appeared normal.  Biopsies were taken in the antrum and angularis.  DUODENUM: The duodenal mucosa showed no abnormalities in the bulb and second portion of the duodenum.  Cold forceps biopsies were taken in the bulb and second portion.  Retroflexed views revealed no abnormalities.     The scope was then withdrawn from the patient and the procedure completed.  COMPLICATIONS: There were no complications.  ENDOSCOPIC IMPRESSION: 1.   The mucosa of the esophagus appeared normal 2.   2 cm hiatal hernia 3.   The mucosa of the stomach appeared normal; biopsies were taken in the antrum and angularis 4.   The duodenal mucosa showed no abnormalities in the bulb and second portion of the duodenum  RECOMMENDATIONS: 1.  Await biopsy results 2.  Proceed with a Colonoscopy.  eSigned:  Jerene Bears, MD 01/03/2014 3:35 PM   CC:The Patient and Abner Greenspan, MD

## 2014-01-03 NOTE — Patient Instructions (Signed)
Impressions/recommendations:  Endoscopy: Hiatal hernia (handout given)  Colonoscopy: Polyp (handout given)  Avoid all NSAIDs for two weeks. May resume 9/16. Tylenol of acetaminophen only until then. Repeat colonoscopy pending pathology results.  YOU HAD AN ENDOSCOPIC PROCEDURE TODAY AT [facility]: Refer to the procedure report that was given to you for any specific questions about what was found during the examination.  If the procedure report does not answer your questions, please call your gastroenterologist to clarify.  If you requested that your care partner not be given the details of your procedure findings, then the procedure report has been included in a sealed envelope for you to review at your convenience later.  YOU SHOULD EXPECT: Some feelings of bloating in the abdomen. Passage of more gas than usual.  Walking can help get rid of the air that was put into your GI tract during the procedure and reduce the bloating. If you had a lower endoscopy (such as a colonoscopy or flexible sigmoidoscopy) you may notice spotting of blood in your stool or on the toilet paper. If you underwent a bowel prep for your procedure, then you may not have a normal bowel movement for a few days.  DIET: Your first meal following the procedure should be a light meal and then it is ok to progress to your normal diet.  A half-sandwich or bowl of soup is an example of a good first meal.  Heavy or fried foods are harder to digest and may make you feel nauseous or bloated.  Likewise meals heavy in dairy and vegetables can cause extra gas to form and this can also increase the bloating.  Drink plenty of fluids but you should avoid alcoholic beverages for 24 hours.  ACTIVITY: Your care partner should take you home directly after the procedure.  You should plan to take it easy, moving slowly for the rest of the day.  You can resume normal activity the day after the procedure however you should NOT DRIVE  or use heavy machinery for 24 hours (because of the sedation medicines used during the test).    SYMPTOMS TO REPORT IMMEDIATELY: A gastroenterologist can be reached at any hour.  During normal business hours, 8:30 AM to 5:00 PM Monday through Friday, call [phone].  After hours and on weekends, please call the GI answering service at [phone] who will take a message and have the physician on call contact you.   Following lower endoscopy (colonoscopy or flexible sigmoidoscopy):  Excessive amounts of blood in the stool  Significant tenderness or worsening of abdominal pains  Swelling of the abdomen that is new, acute  Fever of 100F or higher  Following upper endoscopy (EGD)  Vomiting of blood or coffee ground material  New chest pain or pain under the shoulder blades  Painful or persistently difficult swallowing  New shortness of breath  Fever of 100F or higher  Black, tarry-looking stools  FOLLOW UP: If any biopsies were taken you will be contacted by phone or by letter within the next 1-3 weeks.  Call your gastroenterologist if you have not heard about the biopsies in 3 weeks.  Our staff will call the home number listed on your records the next business day following your procedure to check on you and address any questions or concerns that you may have at that time regarding the information given to you following your procedure. This is a courtesy call and so if there is no answer at the home  number and we have not heard from you through the emergency physician on call, we will assume that you have returned to your regular daily activities without incident.  SIGNATURES/CONFIDENTIALITY: You and/or your care partner have signed paperwork which will be entered into your electronic medical record.  These signatures attest to the fact that that the information above on your After Visit Summary has been reviewed and is understood.  Full responsibility of the confidentiality of this  discharge information lies with you and/or your care-partner.

## 2014-01-03 NOTE — Progress Notes (Signed)
Report to PACU, RN, vss, BBS= Clear.  

## 2014-01-03 NOTE — Progress Notes (Signed)
Called to room to assist during endoscopic procedure.  Patient ID and intended procedure confirmed with present staff. Received instructions for my participation in the procedure from the performing physician.  

## 2014-01-03 NOTE — Op Note (Signed)
[place]  [address] [place], [address]   COLONOSCOPY PROCEDURE REPORT  PATIENT: Nicole Shelton, Nicole Shelton  MR#: [id number] BIRTHDATE: 1966/02/27 , 47  yrs. old GENDER: Female ENDOSCOPIST: Jerene Bears, MD REFERRED ME:QASTM Vernell Morgans, M.D. PROCEDURE DATE:  01/03/2014 PROCEDURE:   Colonoscopy with snare polypectomy First Screening Colonoscopy - Avg.  risk and is 50 yrs.  old or older - No.  Prior Negative Screening - Now for repeat screening. N/A  History of Adenoma - Now for follow-up colonoscopy & has been > or = to 3 yrs.  N/A  Polyps Removed Today? Yes. ASA CLASS:   Class II INDICATIONS:Abdominal pain and Alternating bowel habits with abdominal bloating. MEDICATIONS: MAC sedation, administered by CRNA and propofol (Diprivan) 200mg  IV  DESCRIPTION OF PROCEDURE:   After the risks benefits and alternatives of the procedure were thoroughly explained, informed consent was obtained.  A digital rectal exam revealed no rectal mass.   The LB [id number]-H190 [id number]  endoscope was introduced through the anus and advanced to the terminal ileum which was intubated for a short distance. No adverse events experienced.   The quality of the prep was good, using MoviPrep  The instrument was then slowly withdrawn as the colon was fully examined.  COLON FINDINGS: The mucosa appeared normal in the terminal ileum. A sessile polyp measuring 6 mm in size was found in the transverse colon.  A polypectomy was performed with a cold snare.  The resection was complete and the polyp tissue was completely retrieved.   The colon was redundant.  Manual abdominal counter-pressure was used to reach the cecum.   The colon mucosa was otherwise normal.  Retroflexed views revealed no abnormalities. The time to cecum=6 minutes 01 seconds.  Withdrawal time=12 minutes 30 seconds.  The scope was withdrawn and the procedure completed. COMPLICATIONS: There were no complications.  ENDOSCOPIC  IMPRESSION: 1.   Normal mucosa in the terminal ileum 2.   Sessile polyp measuring 6 mm in size was found in the transverse colon; polypectomy was performed with a cold snare 3.   The colon was redundant 4.   The colon mucosa was otherwise normal  RECOMMENDATIONS: 1.  Await pathology results 2.  Avoid all NSAIDS for the next 2 weeks. 3.  If the polyp removed today is pen to be an adenomatous (pre-cancerous) polyp, you will need a repeat colonoscopy in 5 years.  Otherwise you should continue to follow colorectal cancer screening guidelines for "routine risk" patients with colonoscopy in 10 years.  You will receive a letter within 1-2 weeks with the results of your biopsy as well as final recommendations.  Please call my office if you have not received a letter after 3 weeks. 4.   Office followup next available  eSigned:  Jerene Bears, MD 01/03/2014 3:39 PM      cc: The Patient and Abner Greenspan, MD

## 2014-01-04 ENCOUNTER — Telehealth: Payer: Self-pay | Admitting: *Deleted

## 2014-01-04 NOTE — Telephone Encounter (Signed)
Message left

## 2014-01-13 ENCOUNTER — Encounter: Payer: Self-pay | Admitting: Internal Medicine

## 2014-02-17 ENCOUNTER — Other Ambulatory Visit: Payer: Self-pay

## 2014-03-02 ENCOUNTER — Encounter: Payer: Self-pay | Admitting: Internal Medicine

## 2014-03-02 ENCOUNTER — Ambulatory Visit (INDEPENDENT_AMBULATORY_CARE_PROVIDER_SITE_OTHER): Payer: 59 | Admitting: Internal Medicine

## 2014-03-02 VITALS — BP 92/64 | HR 76 | Ht 66.5 in | Wt 162.0 lb

## 2014-03-02 DIAGNOSIS — R140 Abdominal distension (gaseous): Secondary | ICD-10-CM | POA: Insufficient documentation

## 2014-03-02 DIAGNOSIS — R1084 Generalized abdominal pain: Secondary | ICD-10-CM

## 2014-03-02 DIAGNOSIS — K589 Irritable bowel syndrome without diarrhea: Secondary | ICD-10-CM

## 2014-03-02 DIAGNOSIS — D126 Benign neoplasm of colon, unspecified: Secondary | ICD-10-CM | POA: Insufficient documentation

## 2014-03-02 MED ORDER — PANCRELIPASE (LIP-PROT-AMYL) 24000-76000 UNITS PO CPEP: ORAL_CAPSULE | ORAL | Status: DC

## 2014-03-02 NOTE — Patient Instructions (Signed)
We have  following medications to your pharmacy for you to pick up at your convenience. Please take the voucher provided with you to the pharmacy when you pick up your Creon. Take Bentyl as prescribed. Keep a food diary and bring to the next appointment. Follow up in 8-12 weeks. CC: Loura Pardon MD

## 2014-03-02 NOTE — Progress Notes (Signed)
Subjective:    Patient ID: Nicole Shelton, female    DOB: 01/05/1966, 48 y.o.   MRN: [id number]  HPI Nicole Shelton is a 48 year old female with a past medical history of IBS, GERD, gallstones status post cholecystectomy who is seen in follow-up. She was initially seen on 11/24/2013 to evaluate abdominal pain, bloating and alternating bowel habit. She underwent upper endoscopy and colonoscopy on 01/03/2014. Upper endoscopy showed a 2 cm hiatal hernia but was otherwise normal. Gastric and small bowel biopsies were unrevealing. Colonoscopy to the cecum revealed normal terminal ileum. 6 mm transverse colon polyp removed found to be an adenoma. The colon was otherwise normal. Treated with rifaximin 550 mg 3 times daily 10 days to see if this would help abdominal bloating and symptoms associated with bacterial overgrowth. She was also given Bentyl to use as needed for cramping.   Today she reports that overall she is better. She was reassured after her endoscopies. Still occurring approximately once per week she will have mid abdominal pain which feels like gas pressure and bloating to her. This can be severe and double her over. Episode can last 30 minutes to a few hours. She often lies down and applies pressure to her abdomen which helps with the pain. On most occasions after the discomfort she has loose stools. Otherwise she reports stools have been regular and nonbloody/non-melenic. She does have frequent borborygmi. No fevers or chills. No night sweats. No itching. No jaundice. Good appetite without weight loss. Previous trial of probiotic was unhelpful. Bentyl helps with the abdominal cramping pain when it is mild-to-moderate but doesn't help when it is most severe.   Review of Systems As per history of present illness, otherwise negative  Current Medications, Allergies, Past Medical History, Past Surgical History, Family History and Social History were reviewed in [employer] record.     Objective:   Physical Exam BP 92/64  Pulse 76  Ht 5' 6.5" (1.689 m)  Wt 162 lb (73.483 kg)  BMI 25.76 kg/m2  LMP 02/12/2014 Constitutional: Well-developed and well-nourished. No distress. HEENT: Normocephalic and atraumatic. Oropharynx is clear and moist. No oropharyngeal exudate. Conjunctivae are normal.  No scleral icterus. Neck: Neck supple. Trachea midline. Cardiovascular: Normal rate, regular rhythm and intact distal pulses. No M/R/G Pulmonary/chest: Effort normal and breath sounds normal. No wheezing, rales or rhonchi. Abdominal: Soft, nontender, nondistended. Bowel sounds active throughout. There are no masses palpable. No hepatosplenomegaly. Extremities: no clubbing, cyanosis, or edema Lymphadenopathy: No cervical adenopathy noted. Neurological: Alert and oriented to person place and time. Skin: Skin is warm and dry. No rashes noted. Psychiatric: Normal mood and affect. Behavior is normal.  CBC    Component Value Date/Time   WBC 4.4* 11/01/2012 0954   RBC 4.41 11/01/2012 0954   HGB 13.1 11/01/2012 0954   HCT 38.9 11/01/2012 0954   PLT 281.0 11/01/2012 0954   MCV 88.1 11/01/2012 0954   MCHC 33.8 11/01/2012 0954   RDW 12.7 11/01/2012 0954   LYMPHSABS 1.1 11/01/2012 0954   MONOABS 0.4 11/01/2012 0954   EOSABS 0.0 11/01/2012 0954   BASOSABS 0.0 11/01/2012 0954    CMP     Component Value Date/Time   NA 137 11/01/2012 0954   K 4.2 11/01/2012 0954   CL 109 11/01/2012 0954   CO2 20 11/01/2012 0954   GLUCOSE 72 11/01/2012 0954   BUN 13 11/01/2012 0954   CREATININE 0.6 11/01/2012 0954   CALCIUM 8.8 11/01/2012 0954   PROT 8.0  11/24/2013 1032   ALBUMIN 4.0 11/24/2013 1032   AST 25 11/24/2013 1032   ALT 21 11/24/2013 1032   ALKPHOS 45 11/24/2013 1032   BILITOT 0.8 11/24/2013 1032   GFRNONAA 99 05/15/2008 0951   GFRAA 119 05/15/2008 0951   Abd US performed at [facility] reviewed. IOC after laparoscopic cholecystectomy reviewed  Upper endoscopy and colonoscopy reviewed with  the patient including pathology results    Assessment & Plan:  48 year old female with a past medical history of IBS, GERD, gallstones status post cholecystectomy who is seen in follow-up.  1. Abdominal pain/gas/bloating -- overall she seems better and her symptoms do seem most consistent with irritable bowel syndrome. We have not found any other pathology including no evidence of H. pylori, celiac disease, IBD. She is status post cholecystectomy. She did not benefit from a trial of rifaximin.  We discussed continued FODMAP diet, I would like her to keep a food diary to see if there are any dietary triggers that can be identified. I will also give her a trial of Creon 1 capsule with meals. We discussed how pink-red again signs have been proven helpful in some patients with IBS. Return in 8-12 weeks, sooner if necessary. If symptoms continue, cross-sectional imaging will be considered  2. Adenomatous colon polyp -- repeat colonoscopy in 5 years.

## 2014-03-06 ENCOUNTER — Encounter: Payer: Self-pay | Admitting: Internal Medicine

## 2014-05-11 ENCOUNTER — Encounter: Payer: Self-pay | Admitting: Internal Medicine

## 2014-05-11 ENCOUNTER — Ambulatory Visit (INDEPENDENT_AMBULATORY_CARE_PROVIDER_SITE_OTHER): Payer: 59 | Admitting: Internal Medicine

## 2014-05-11 VITALS — BP 112/64 | HR 68 | Ht 66.5 in | Wt 161.0 lb

## 2014-05-11 DIAGNOSIS — K589 Irritable bowel syndrome without diarrhea: Secondary | ICD-10-CM

## 2014-05-11 DIAGNOSIS — R140 Abdominal distension (gaseous): Secondary | ICD-10-CM

## 2014-05-11 DIAGNOSIS — D126 Benign neoplasm of colon, unspecified: Secondary | ICD-10-CM

## 2014-05-11 NOTE — Progress Notes (Signed)
   Subjective:    Patient ID: Nicole Shelton, female    DOB: 1965/07/28, 49 y.o.   MRN: [id number]  HPI Nicole Shelton is a 49 year old female with a past medical history of IBS, adenomatous colon polyp, GERD, gallstones status post cholecystectomy who is seen in follow-up. She was last seen on 03/02/2014. She has been seen primarily to evaluate IBS symptoms alternating bowel habits, bloating and abdominal pain. After her last visit we started her on Creon 1 capsule with meals after she failed to improve with rifaximin. She reports dramatic improvement with one Creon tablet before meals. This is helped abdominal pain, bloating and improved her bowel regularity.. She reports this has been a "drastic" change for the better. No fevers or chills. No trouble swallowing. No heartburn. No abdominal pain today. No blood in her stool or melena. She is occasionally using dicyclomine which helps possibly a little.   Review of Systems As per history of present illness, otherwise negative  Current Medications, Allergies, Past Medical History, Past Surgical History, Family History and Social History were reviewed in [employer] record.     Objective:   Physical Exam BP 112/64 mmHg  Pulse 68  Ht 5' 6.5" (1.689 m)  Wt 161 lb (73.029 kg)  BMI 25.60 kg/m2 Constitutional: Well-developed and well-nourished. No distress. HEENT: Normocephalic and atraumatic. Oropharynx is clear and moist. No oropharyngeal exudate. Conjunctivae are normal.  No scleral icterus. Neck: Neck supple. Trachea midline. Cardiovascular: Normal rate, regular rhythm and intact distal pulses. No M/R/G Pulmonary/chest: Effort normal and breath sounds normal. No wheezing, rales or rhonchi. Abdominal: Soft, nontender, nondistended. Bowel sounds active throughout. There are no masses palpable. No hepatosplenomegaly. Extremities: no clubbing, cyanosis, or edema Neurological: Alert and oriented to person place and  time. Psychiatric: Normal mood and affect. Behavior is normal.  Celiac test negative EGD and colonoscopy reviewed    Assessment & Plan:  49 year old female with a past medical history of IBS, adenomatous colon polyp, GERD, gallstones status post cholecystectomy who is seen in follow-up.   1. IBS -- sometimes predominantly abdominal pain and bloating. Dramatic improvement with Creon 1 capsule with meals. She reports she is feeling so much better at times she will not use Creon with meals but she does use it before larger meals. She can continue to use Creon 1 capsule before meals and before snacks. She is also reminded the benefits of FODMAP diet in patients with IBS.  I asked that she notify me should symptoms worsen or change in any way. She voices understanding. She prefers to follow-up as needed.  2. Adenomatous colon polyp -- repeat colonoscopy recommended a 5 year interval. She is aware of this recommendation

## 2014-05-11 NOTE — Patient Instructions (Signed)
Please follow up with Dr Hilarie Fredrickson as needed.  CC:Dr [facility]

## 2014-06-22 ENCOUNTER — Ambulatory Visit: Payer: 59 | Admitting: Obstetrics and Gynecology

## 2014-06-22 ENCOUNTER — Encounter: Payer: Self-pay | Admitting: Obstetrics and Gynecology

## 2014-06-22 ENCOUNTER — Ambulatory Visit (INDEPENDENT_AMBULATORY_CARE_PROVIDER_SITE_OTHER): Payer: BLUE CROSS/BLUE SHIELD | Admitting: Obstetrics and Gynecology

## 2014-06-22 VITALS — BP 120/72 | HR 72 | Resp 16 | Ht 66.5 in | Wt 164.0 lb

## 2014-06-22 DIAGNOSIS — R8290 Unspecified abnormal findings in urine: Secondary | ICD-10-CM

## 2014-06-22 DIAGNOSIS — N761 Subacute and chronic vaginitis: Secondary | ICD-10-CM

## 2014-06-22 DIAGNOSIS — Z01419 Encounter for gynecological examination (general) (routine) without abnormal findings: Secondary | ICD-10-CM

## 2014-06-22 LAB — COMPREHENSIVE METABOLIC PANEL
ALT: 16 U/L (ref 0–35)
AST: 20 U/L (ref 0–37)
Albumin: 3.8 g/dL (ref 3.5–5.2)
Alkaline Phosphatase: 50 U/L (ref 39–117)
BILIRUBIN TOTAL: 0.4 mg/dL (ref 0.2–1.2)
BUN: 14 mg/dL (ref 6–23)
CO2: 26 mEq/L (ref 19–32)
CREATININE: 0.72 mg/dL (ref 0.50–1.10)
Calcium: 8.8 mg/dL (ref 8.4–10.5)
Chloride: 104 mEq/L (ref 96–112)
Glucose, Bld: 81 mg/dL (ref 70–99)
Potassium: 4.2 mEq/L (ref 3.5–5.3)
SODIUM: 136 meq/L (ref 135–145)
Total Protein: 7.1 g/dL (ref 6.0–8.3)

## 2014-06-22 LAB — CBC
HEMATOCRIT: 37.4 % (ref 36.0–46.0)
HEMOGLOBIN: 13 g/dL (ref 12.0–15.0)
MCH: 29.3 pg (ref 26.0–34.0)
MCHC: 34.8 g/dL (ref 30.0–36.0)
MCV: 84.2 fL (ref 78.0–100.0)
MPV: 8.8 fL (ref 8.6–12.4)
Platelets: 266 10*3/uL (ref 150–400)
RBC: 4.44 MIL/uL (ref 3.87–5.11)
RDW: 13.8 % (ref 11.5–15.5)
WBC: 4.6 10*3/uL (ref 4.0–10.5)

## 2014-06-22 LAB — LIPID PANEL
Cholesterol: 215 mg/dL — ABNORMAL HIGH (ref 0–200)
HDL: 44 mg/dL (ref >39)
LDL Cholesterol: 133 mg/dL — ABNORMAL HIGH (ref 0–99)
Total CHOL/HDL Ratio: 4.9 Ratio
Triglycerides: 189 mg/dL — ABNORMAL HIGH (ref <150)
VLDL: 38 mg/dL (ref 0–40)

## 2014-06-22 NOTE — Patient Instructions (Signed)

## 2014-06-22 NOTE — Progress Notes (Signed)
Patient ID: Nicole Shelton, female   DOB: [birth year], 49 y.o.   MRN: [id number] 49 y.o. [id number] MarriedCaucasianF here for annual exam.    Doing consulting work now.  Less stressful.  Not having stomach issues now that her job has changed.   Having migraine headaches right before her menses.  Has right sided obscured vision and then has a really bad headache.  Does not occur at other times of the month.   Patient states she continues to have a discharge 3 out of 4 weeks per month.  Mostly clear but it can be extensive.  Wearing a panty liner constantly.  No itching or burning.  No odor.  When used over the counter medication for vaginitis, had burning symptoms.   Patient's last menstrual period was 06/10/2014 (exact date).          Sexually active: Yes.    The current method of family planning is vasectomy.    Exercising: No.  The patient does not participate in regular exercise at present. Smoker:  no  Health Maintenance: Pap:  06/22/12, negative with neg  HR HPV History of abnormal Pap:  no MMG:  06/02/14 Breast Density C: Bi-Rads 1: Negative Colonoscopy:  September/october 2015 1 polyp found f/u in 2020 BMD:   none TDaP:  2013 Screening Labs: 2014, Hb today: 12.7, Urine today: WBC's ++ - no dysuria.    reports that she has never smoked. She has never used smokeless tobacco. She reports that she does not drink alcohol or use illicit drugs.  Past Medical History  Diagnosis Date  . History of chicken pox   . Seasonal allergies   . Hyperlipemia   . History of recurrent UTIs   . IBS (irritable bowel syndrome)   . Urinary incontinence     with laughing,coughing,sneezing  . GERD (gastroesophageal reflux disease)   . Gallstones 2014    Past Surgical History  Procedure Laterality Date  . Dilation and curettage of uterus    . Cholecystectomy  2014  . Mandible surgery      Current Outpatient Prescriptions  Medication Sig Dispense Refill  . dicyclomine (BENTYL) 20 MG  tablet Take 1 tablet (20 mg total) by mouth 3 (three) times daily as needed for spasms. 90 tablet 5  . Ibuprofen (ADVIL PO) Take by mouth as needed.    . Pancrelipase, Lip-Prot-Amyl, 24000 UNITS CPEP 1 pill with every meal or large snack (Patient taking differently: 1 pill with most large meals) 100 capsule 3   No current facility-administered medications for this visit.    Family History  Problem Relation Age of Onset  . Hyperlipidemia Mother     on medication recently  . Pulmonary fibrosis Father     double lung transplant  . Heart disease Maternal Grandfather   . Bladder Cancer Maternal Grandmother   . Osteoarthritis Paternal Grandmother   . Osteoarthritis Paternal Grandfather   . Irritable bowel syndrome Mother   . Colon cancer Neg Hx     ROS:  Pertinent items are noted in HPI.  Otherwise, a comprehensive ROS was negative.  Exam:   BP 120/72 mmHg  Pulse 72  Resp 16  Ht 5' 6.5" (1.689 m)  Wt 164 lb (74.39 kg)  BMI 26.08 kg/m2  LMP 06/10/2014 (Exact Date)     Height: 5' 6.5" (168.9 cm)  Ht Readings from Last 3 Encounters:  06/22/14 5' 6.5" (1.689 m)  05/11/14 5' 6.5" (1.689 m)  03/02/14 5' 6.5" (1.689 m)  General appearance: alert, cooperative and appears stated age Head: Normocephalic, without obvious abnormality, atraumatic Neck: no adenopathy, supple, symmetrical, trachea midline and thyroid normal to inspection and palpation Lungs: clear to auscultation bilaterally Breasts: normal appearance, no masses or tenderness, Inspection negative, No nipple retraction or dimpling, No nipple discharge or bleeding Heart: regular rate and rhythm Abdomen: soft, non-tender; bowel sounds normal; no masses,  no organomegaly Extremities: extremities normal, atraumatic, no cyanosis or edema Skin: Skin color, texture, turgor normal. No rashes or lesions Lymph nodes: Cervical, supraclavicular, and axillary nodes normal. No abnormal inguinal nodes palpated Neurologic: Grossly  normal   Pelvic: External genitalia:  no lesions. Mild generalized erythema of vulva.              Urethra:  normal appearing urethra with no masses, tenderness or lesions              Bartholins and Skenes: normal                 Vagina: normal appearing vagina with normal color, no lesions, white curd like discharge.                Cervix: no lesions              Pap taken: No. Bimanual Exam:  Uterus:  normal size, contour, position, consistency, mobility, non-tender              Adnexa: normal adnexa.  No masses.               Rectovaginal: Confirms               Anus:  normal sphincter tone, no lesions  Chaperone was present for exam.  A:  Well Woman with normal exam Positive urine dip with WBCs.  Chronic vaginitis.  Menopausal migraine headaches with aura.  P:   Mammogram yearly. pap smear not indicated.  Urine micro and Affrim testing.  Use hypoallergenic pads. I will have patient see her PCP regarding her migraine headaches with aura for further evaluation and treatment.  Lipids, CMP, CBC. return annually or prn

## 2014-06-23 LAB — URINALYSIS, MICROSCOPIC ONLY
Casts: NONE SEEN
Crystals: NONE SEEN

## 2014-06-23 LAB — WET PREP BY MOLECULAR PROBE
Candida species: POSITIVE — AB
Gardnerella vaginalis: POSITIVE — AB
Trichomonas vaginosis: NEGATIVE

## 2014-06-24 LAB — URINE CULTURE: Colony Count: >=100000

## 2014-06-25 ENCOUNTER — Other Ambulatory Visit: Payer: Self-pay | Admitting: Obstetrics and Gynecology

## 2014-06-25 MED ORDER — METRONIDAZOLE 500 MG PO TABS: 500.0000 mg | ORAL_TABLET | Freq: Two times a day (BID) | ORAL | Status: DC

## 2014-06-25 MED ORDER — FLUCONAZOLE 150 MG PO TABS: 150.0000 mg | ORAL_TABLET | Freq: Once | ORAL | Status: DC

## 2014-08-25 NOTE — Op Note (Signed)
PATIENT NAME:  Nicole Shelton, Nicole Shelton MR#:  [id number] DATE OF BIRTH:  Apr 15, 1966  DATE OF PROCEDURE:  12/02/2012  PREOPERATIVE DIAGNOSIS:  Chronic cholecystitis and cholelithiasis.   POSTOPERATIVE DIAGNOSIS:  Chronic cholecystitis and cholelithiasis.  OPERATIVE PROCEDURE: Laparoscopic cholecystectomy with intraoperative cholangiograms.   SURGEON:  Hervey Ard, MD.   ANESTHESIA:  General endotracheal under Dr. Carolin Sicks.   ESTIMATED BLOOD LOSS:  Less than 5 mL.   CLINICAL NOTE:  This 49 year old woman has had episodic right upper quadrant pain and ultrasound showed evidence of cholelithiasis. She was felt to be a candidate for elective cholecystectomy.   OPERATIVE NOTE: With the patient under adequate general endotracheal anesthesia, the abdomen was prepped with ChloraPrep and draped. In Trendelenburg position, a Veress needle was placed through a transumbilical incision. After assuring intra-abdominal location with a hanging drop test, the abdomen was insufflated with CO2 at 10 mmHg pressure. It appeared that the needle catheter may have been partially above the level of the peritoneum, as there was better distention below the umbilicus and above. The needle was repositioned and the insufflation completed. After dilatation to a 10 mm step port, inspection showed no evidence of injury from initial port placement. There was, however, evidence of CO2 distention of the peritoneum. The patient was placed in reversed Trendelenburg position and rolled to the left. An 11 mm Xcel port was placed in the epigastrium tube. Two 5 mm step ports were placed on the right lateral abdominal wall. The gallbladder was placed on cephalad traction. Scarring around the neck of the gallbladder was evident. The cystic duct was isolated. Fluoroscopic cholangiograms were completed using 5 mL of one-half strength Conray 60. This showed prompt reflux into the right and left hepatic ducts and free flow into the common bile  duct and free flow into the duodenum. No evidence of retained stones. The cystic duct and cystic artery were doubly clipped and divided. The gallbladder was then removed from the liver bed making use of hook cautery dissection. An Endo Catch bag was utilized as it had been opened by the staff. The gallbladder was delivered through the umbilical port site and sent for routine histology. Inspection from the epigastric site was unremarkable. The right lower quadrant and lateral abdominal wall was examined. The right colon appeared unremarkable. The appendix was visualized and showed no evidence of chronic or acute inflammation. The liver and anterior surface of the stomach was unremarkable. The visualized portion of the colon was normal.   The abdomen was desufflated under direct vision after irrigation with lactated Ringer's solution. Skin incisions were closed with 4-0 Vicryl subcuticular sutures. Benzoin, Steri-Strips, Telfa and Tegaderm dressings were applied.  The patient tolerated the procedure well and was taken to the recovery room in stable condition.    ____________________________ Nicole Bellow, MD jwb:ce D: 12/02/2012 11:42:34 ET T: 12/02/2012 12:15:13 ET JOB#: [id number]  cc: Nicole Bellow, MD, <Dictator> Marne A. Glori Bickers, MD  Conner Muegge Amedeo Kinsman MD ELECTRONICALLY SIGNED 12/03/2012 14:10

## 2015-06-27 ENCOUNTER — Ambulatory Visit (INDEPENDENT_AMBULATORY_CARE_PROVIDER_SITE_OTHER): Payer: BLUE CROSS/BLUE SHIELD | Admitting: Obstetrics and Gynecology

## 2015-06-27 ENCOUNTER — Encounter: Payer: Self-pay | Admitting: Obstetrics and Gynecology

## 2015-06-27 VITALS — BP 106/60 | HR 70 | Resp 16 | Ht 66.5 in | Wt 155.8 lb

## 2015-06-27 DIAGNOSIS — Z0000 Encounter for general adult medical examination without abnormal findings: Secondary | ICD-10-CM | POA: Diagnosis not present

## 2015-06-27 DIAGNOSIS — N393 Stress incontinence (female) (male): Secondary | ICD-10-CM

## 2015-06-27 DIAGNOSIS — Z01419 Encounter for gynecological examination (general) (routine) without abnormal findings: Secondary | ICD-10-CM | POA: Diagnosis not present

## 2015-06-27 DIAGNOSIS — N760 Acute vaginitis: Secondary | ICD-10-CM

## 2015-06-27 DIAGNOSIS — N941 Unspecified dyspareunia: Secondary | ICD-10-CM | POA: Diagnosis not present

## 2015-06-27 LAB — COMPREHENSIVE METABOLIC PANEL
ALK PHOS: 42 U/L (ref 33–115)
ALT: 9 U/L (ref 6–29)
AST: 17 U/L (ref 10–35)
Albumin: 4 g/dL (ref 3.6–5.1)
BILIRUBIN TOTAL: 0.5 mg/dL (ref 0.2–1.2)
BUN: 14 mg/dL (ref 7–25)
CALCIUM: 8.6 mg/dL (ref 8.6–10.2)
CO2: 22 mmol/L (ref 20–31)
Chloride: 105 mmol/L (ref 98–110)
Creat: 0.85 mg/dL (ref 0.50–1.10)
Glucose, Bld: 73 mg/dL (ref 65–99)
POTASSIUM: 4.9 mmol/L (ref 3.5–5.3)
Sodium: 136 mmol/L (ref 135–146)
TOTAL PROTEIN: 7.5 g/dL (ref 6.1–8.1)

## 2015-06-27 LAB — LIPID PANEL
CHOLESTEROL: 209 mg/dL — AB (ref 125–200)
HDL: 47 mg/dL (ref >=46)
LDL Cholesterol: 144 mg/dL — ABNORMAL HIGH (ref <130)
Total CHOL/HDL Ratio: 4.4 Ratio (ref <=5.0)
Triglycerides: 89 mg/dL (ref <150)
VLDL: 18 mg/dL (ref <30)

## 2015-06-27 LAB — CBC
HEMATOCRIT: 40.5 % (ref 36.0–46.0)
Hemoglobin: 14 g/dL (ref 12.0–15.0)
MCH: 29.1 pg (ref 26.0–34.0)
MCHC: 34.6 g/dL (ref 30.0–36.0)
MCV: 84.2 fL (ref 78.0–100.0)
MPV: 9 fL (ref 8.6–12.4)
PLATELETS: 280 10*3/uL (ref 150–400)
RBC: 4.81 MIL/uL (ref 3.87–5.11)
RDW: 13.6 % (ref 11.5–15.5)
WBC: 3.6 10*3/uL — ABNORMAL LOW (ref 4.0–10.5)

## 2015-06-27 LAB — POCT URINALYSIS DIPSTICK
Bilirubin, UA: NEGATIVE
Glucose, UA: NEGATIVE
Ketones, UA: NEGATIVE
Leukocytes, UA: NEGATIVE
Nitrite, UA: NEGATIVE
PROTEIN UA: NEGATIVE
RBC UA: NEGATIVE
UROBILINOGEN UA: NEGATIVE
pH, UA: 5

## 2015-06-27 NOTE — Progress Notes (Signed)
Patient ID: Nicole Shelton, female   DOB: [birth year], 50 y.o.   MRN: [id number] 50 y.o. [id number] Married Caucasian female here for annual exam.    Menses are regular.  Last 3 days, first day is heaviest. Some cramping and cramping if clotting.   Hx menstrual migraine headaches with aura.  Has had migraines with aura outside of her menstrual time as well.  Reporting vaginal discharge. This is constant and bothersome.  This occur every month.  Has odor once in a while.  Has used Monistat in the past, and this helps.  Treated for BV and yeast in the past.  Has concerns about Flagyl use and increasing risk of cancer.   Painful intercourse most of the time. No dryness issues. Feels itching, irritative and burning.  Burning with urination afterward.  Urinary incontinence.  Can leak with jumping jacks, cough, sneeze.  Wears a panty liner all the time.  Son is now 48 and driving.   PCP:  Rica Mote, MD     Patient's last menstrual period was 05/31/2015 (exact date).          Sexually active: Yes.   female The current method of family planning is vasectomy.    Exercising: Yes.    walks Smoker:  no  Health Maintenance: Pap:  06-22-12 Neg:Neg HR HPV History of abnormal Pap:  no MMG:  06-15-15 3D/Density Cat.C/Neg/BiRads1:[facility] Colonoscopy:  01-03-14 Adenomatous polyp with Dr. Leticia Penna due 01/2019. BMD:   n/a  Result  n/a TDaP:  10-08-11 Screening Labs:  Hb today: 13.4, Urine today: Neg   reports that she has never smoked. She has never used smokeless tobacco. She reports that she does not drink alcohol or use illicit drugs.  Past Medical History  Diagnosis Date  . History of chicken pox   . Seasonal allergies   . Hyperlipemia   . History of recurrent UTIs   . IBS (irritable bowel syndrome)   . Urinary incontinence     with laughing,coughing,sneezing  . GERD (gastroesophageal reflux disease)   . Gallstones 2014  . Migraines     with aura.      Past Surgical History   Procedure Laterality Date  . Dilation and curettage of uterus    . Cholecystectomy  2014  . Mandible surgery      Current Outpatient Prescriptions  Medication Sig Dispense Refill  . Ibuprofen (ADVIL PO) Take by mouth as needed.    Marland Kitchen PREVIDENT 5000 SENSITIVE 1.1-5 % PSTE Take 1 application by mouth daily.  3   No current facility-administered medications for this visit.    Family History  Problem Relation Age of Onset  . Hyperlipidemia Mother     on medication recently  . Irritable bowel syndrome Mother   . Pulmonary fibrosis Father     double lung transplant  . Heart disease Maternal Grandfather   . Bladder Cancer Maternal Grandmother   . Osteoarthritis Paternal Grandmother   . Osteoarthritis Paternal Grandfather   . Colon cancer Neg Hx     ROS:  Pertinent items are noted in HPI.  Otherwise, a comprehensive ROS was negative.  Exam:   BP 106/60 mmHg  Pulse 70  Resp 16  Ht 5' 6.5" (1.689 m)  Wt 155 lb 12.8 oz (70.67 kg)  BMI 24.77 kg/m2  LMP 05/31/2015 (Exact Date)    General appearance: alert, cooperative and appears stated age Head: Normocephalic, without obvious abnormality, atraumatic Neck: no adenopathy, supple, symmetrical, trachea midline and thyroid normal to inspection  and palpation Lungs: clear to auscultation bilaterally Breasts: normal appearance, no masses or tenderness, Inspection negative, No nipple retraction or dimpling, No nipple discharge or bleeding, No axillary or supraclavicular adenopathy Heart: regular rate and rhythm Abdomen: soft, non-tender; bowel sounds normal; no masses,  no organomegaly Extremities: extremities normal, atraumatic, no cyanosis or edema Skin: Skin color, texture, turgor normal. No rashes or lesions Lymph nodes: Cervical, supraclavicular, and axillary nodes normal. No abnormal inguinal nodes palpated Neurologic: Grossly normal  Pelvic: External genitalia:  Erythema of vulva.                Urethra:  normal appearing  urethra with no masses, tenderness or lesions              Bartholins and Skenes: normal                 Vagina: normal appearing vagina with normal color and discharge, no lesions              Cervix: no lesions              Pap taken: Yes.   Bimanual Exam:  Uterus:  normal size, contour, position, consistency, mobility, non-tender              Adnexa: normal adnexa and no mass, fullness, tenderness              Rectovaginal: Yes.  .  Confirms.              Anus:  normal sphincter tone, no lesions  Chaperone was present for exam.  Assessment:   Well woman visit with normal exam. Hypertriglyceridemia.  Dyspareunia.  Chronic vaginitis. May be multiple etiologies including dermatitis from pad use, bacterial vaginosis/candida, and atrophy.  Hx migraine with aura.  Genuine stress incontinence.   Plan: Yearly mammogram recommended after age 73.  Recommended self breast exam.  Pap and HR HPV as above. Discussed Calcium, Vitamin D, regular exercise program including cardiovascular and weight bearing exercise. Labs performed.  Yes.  .   See orders.  Affirm and routine labs. Refills given on medications.  No..    I suspect some of the patient's vulvar symptoms are from chronic pad use.  I recommend switching to a hypoallergenic pad. I discussed vaginitis with patient and the various etiologies - yeast, BV, atrophy.   I discussed the use of vaginal estrogens for local treatment of atrophy.  Risks can include DVT, PE, MI, stroke cancer.  Will not prescribe at this time and wait for Affirm results.  Referral to Ileana Roup for pelvic floor PT. Follow up annually and prn.   An additional 15 minutes of face to face time discussing vaginitis, dyapareunia, and genuine stress incontinence.  Over 50% was spent in counseling.   After visit summary provided.

## 2015-06-27 NOTE — Patient Instructions (Addendum)
EXERCISE AND DIET:  We recommended that you start or continue a regular exercise program for good health. Regular exercise means any activity that makes your heart beat faster and makes you sweat.  We recommend exercising at least 30 minutes per day at least 3 days a week, preferably 4 or 5.  We also recommend a diet low in fat and sugar.  Inactivity, poor dietary choices and obesity can cause diabetes, heart attack, stroke, and kidney damage, among others.    ALCOHOL AND SMOKING:  Women should limit their alcohol intake to no more than 7 drinks/beers/glasses of wine (combined, not each!) per week. Moderation of alcohol intake to this level decreases your risk of breast cancer and liver damage. And of course, no recreational drugs are part of a healthy lifestyle.  And absolutely no smoking or even second hand smoke. Most people know smoking can cause heart and lung diseases, but did you know it also contributes to weakening of your bones? Aging of your skin?  Yellowing of your teeth and nails?  CALCIUM AND VITAMIN D:  Adequate intake of calcium and Vitamin D are recommended.  The recommendations for exact amounts of these supplements seem to change often, but generally speaking 600 mg of calcium (either carbonate or citrate) and 800 units of Vitamin D per day seems prudent. Certain women may benefit from higher intake of Vitamin D.  If you are among these women, your doctor will have told you during your visit.    PAP SMEARS:  Pap smears, to check for cervical cancer or precancers,  have traditionally been done yearly, although recent scientific advances have shown that most women can have pap smears less often.  However, every woman still should have a physical exam from her gynecologist every year. It will include a breast check, inspection of the vulva and vagina to check for abnormal growths or skin changes, a visual exam of the cervix, and then an exam to evaluate the size and shape of the uterus and  ovaries.  And after 48 years of age, a rectal exam is indicated to check for rectal cancers. We will also provide age appropriate advice regarding health maintenance, like when you should have certain vaccines, screening for sexually transmitted diseases, bone density testing, colonoscopy, mammograms, etc.   MAMMOGRAMS:  All women over 40 years old should have a yearly mammogram. Many facilities now offer a "3D" mammogram, which may cost around $50 extra out of pocket. If possible,  we recommend you accept the option to have the 3D mammogram performed.  It both reduces the number of women who will be called back for extra views which then turn out to be normal, and it is better than the routine mammogram at detecting truly abnormal areas.    COLONOSCOPY:  Colonoscopy to screen for colon cancer is recommended for all women at age 50.  We know, you hate the idea of the prep.  We agree, BUT, having colon cancer and not knowing it is worse!!  Colon cancer so often starts as a polyp that can be seen and removed at colonscopy, which can quite literally save your life!  And if your first colonoscopy is normal and you have no family history of colon cancer, most women don't have to have it again for 10 years.  Once every ten years, you can do something that may end up saving your life, right?  We will be happy to help you get it scheduled when you are ready.    Be sure to check your insurance coverage so you understand how much it will cost.  It may be covered as a preventative service at no cost, but you should check your particular policy.     Atrophic Vaginitis Atrophic vaginitis is a condition in which the tissues that line the vagina become dry and thin. This condition is most common in women who have stopped having regular menstrual periods (menopause). This usually starts when a woman is 66-31 years old. Estrogen helps to keep the vagina moist. It stimulates the vagina to produce a clear fluid that lubricates  the vagina for sexual intercourse. This fluid also protects the vagina from infection. Lack of estrogen can cause the lining of the vagina to get thinner and dryer. The vagina may also shrink in size. It may become less elastic. Atrophic vaginitis tends to get worse over time as a woman's estrogen level drops. CAUSES This condition is caused by the normal drop in estrogen that happens around the time of menopause. RISK FACTORS Certain conditions or situations may lower a woman's estrogen level, which increases her risk of atrophic vaginitis. These include:  Taking medicine that blocks estrogen.  Having ovaries removed surgically.  Being treated for cancer with X-ray treatment (radiation) or medicines (chemotherapy).  Exercising very hard and often.  Having an eating disorder (anorexia).  Giving birth or breastfeeding.  Being over the age of 5.  Smoking. SYMPTOMS Symptoms of this condition include:  Pain, soreness, or bleeding during sexual intercourse (dyspareunia).  Vaginal burning, irritation, or itching.  Pain or bleeding during a vaginal examination using a speculum (pelvic exam).  Loss of interest in sexual activity.  Having burning pain when passing urine.  Vaginal discharge that is brown or yellow. In some cases, there are no symptoms. DIAGNOSIS This condition is diagnosed with a medical history and physical exam. This will include a pelvic exam that checks whether the inside of your vagina appears pale, thin, or dry. Rarely, you may also have other tests, including:  A urine test.  A test that checks the acid balance in your vaginal fluid (acid balance test). TREATMENT Treatment for this condition may depend on the severity of your symptoms. Treatment may include:  Using an over-the-counter vaginal lubricant before you have sexual intercourse.  Using a long-acting vaginal moisturizer.  Using low-dose vaginal estrogen for moderate to severe symptoms that do  not respond to other treatments. Options include creams, tablets, and inserts (vaginal rings). Before using vaginal estrogen, tell your health care provider if you have a history of:  Breast cancer.  Endometrial cancer.  Blood clots.  Taking medicines. You may be able to take a daily pill for dyspareunia. Discuss all of the risks of this medicine with your health care provider. It is usually not recommended for women who have a family history or personal history of breast cancer. If your symptoms are very mild and you are not sexually active, you may not need treatment. HOME CARE INSTRUCTIONS  Take medicines only as directed by your health care provider. Do not use herbal or alternative medicines unless your health care provider says that you can.  Use over-the-counter creams, lubricants, or moisturizers for dryness only as directed by your health care provider.  If your atrophic vaginitis is caused by menopause, discuss all of your menopausal symptoms and treatment options with your health care provider.  Do not douche.  Do not use products that can make your vagina dry. These include:  Scented feminine sprays.  Scented tampons.  Scented soaps.  If it hurts to have sex, talk with your sexual partner. SEEK MEDICAL CARE IF:  Your discharge looks different than normal.  Your vagina has an unusual smell.  You have new symptoms.  Your symptoms do not improve with treatment.  Your symptoms get worse.   This information is not intended to replace advice given to you by your health care provider. Make sure you discuss any questions you have with your health care provider.   Document Released: 09/05/2014 Document Reviewed: 09/05/2014 Elsevier Interactive Patient Education 2016 Elsevier Inc.  Estradiol vaginal cream What is this medicine? ESTRADIOL (es tra DYE ole) contains the female hormone estrogen. It is used for symptoms of menopause, like vaginal dryness and  irritation. This medicine may be used for other purposes; ask your health care provider or pharmacist if you have questions. What should I tell my health care provider before I take this medicine? They need to know if you have any of these conditions: -abnormal vaginal bleeding -blood vessel disease or blood clots -breast, cervical, endometrial, ovarian, liver, or uterine cancer -dementia -diabetes -gallbladder disease -heart disease or recent heart attack -high blood pressure -high cholesterol -high levels of calcium in the blood -hysterectomy -kidney disease -liver disease -migraine headaches -protein C deficiency -protein S deficiency -stroke -systemic lupus erythematosus (SLE) -tobacco smoker -an unusual or allergic reaction to estrogens, other hormones, soy, other medicines, foods, dyes, or preservatives -pregnant or trying to get pregnant -breast-feeding How should I use this medicine? This medicine is for use in the vagina only. Do not take by mouth. Follow the directions on the prescription label. Read package directions carefully before using. Use the special applicator supplied with the cream. Wash hands before and after use. Fill the applicator with the prescribed amount of cream. Lie on your back, part and bend your knees. Insert the applicator into the vagina and push the plunger to expel the cream into the vagina. Wash the applicator with warm soapy water and rinse well. Use exactly as directed for the complete length of time prescribed. Do not stop using except on the advice of your doctor or health care professional. A patient package insert for the product will be given with each prescription and refill. Read this sheet carefully each time. The sheet may change frequently. Talk to your pediatrician regarding the use of this medicine in children. This medicine is not approved for use in children. Overdosage: If you think you have taken too much of this medicine contact a  poison control center or emergency room at once. NOTE: This medicine is only for you. Do not share this medicine with others. What if I miss a dose? If you miss a dose, use it as soon as you can. If it is almost time for your next dose, use only that dose. Do not use double or extra doses. What may interact with this medicine? Do not take this medicine with any of the following medications: -aromatase inhibitors like aminoglutethimide, anastrozole, exemestane, letrozole, testolactone This medicine may also interact with the following medications: -barbiturates used for inducing sleep or treating seizures -carbamazepine -grapefruit juice -medicines for fungal infections like ketoconazole and itraconazole -raloxifene -rifabutin -rifampin -rifapentine -ritonavir -some antibiotics used to treat infections -St. John's Wort -tamoxifen -warfarin This list may not describe all possible interactions. Give your health care provider a list of all the medicines, herbs, non-prescription drugs, or dietary supplements you use. Also tell them if you smoke, drink alcohol, or  use illegal drugs. Some items may interact with your medicine. What should I watch for while using this medicine? Visit your health care professional for regular checks on your progress. You will need a regular breast and pelvic exam. You should also discuss the need for regular mammograms with your health care professional, and follow his or her guidelines. This medicine can make your body retain fluid, making your fingers, hands, or ankles swell. Your blood pressure can go up. Contact your doctor or health care professional if you feel you are retaining fluid. If you have any reason to think you are pregnant, stop taking this medicine at once and contact your doctor or health care professional. Tobacco smoking increases the risk of getting a blood clot or having a stroke, especially if you are more than 48 years old. You are strongly  advised not to smoke. If you wear contact lenses and notice visual changes, or if the lenses begin to feel uncomfortable, consult your eye care specialist. If you are going to have elective surgery, you may need to stop taking this medicine beforehand. Consult your health care professional for advice prior to scheduling the surgery. What side effects may I notice from receiving this medicine? Side effects that you should report to your doctor or health care professional as soon as possible: -allergic reactions like skin rash, itching or hives, swelling of the face, lips, or tongue -breast tissue changes or discharge -changes in vision -chest pain -confusion, trouble speaking or understanding -dark urine -general ill feeling or flu-like symptoms -light-colored stools -nausea, vomiting -pain, swelling, warmth in the leg -right upper belly pain -severe headaches -shortness of breath -sudden numbness or weakness of the face, arm or leg -trouble walking, dizziness, loss of balance or coordination -unusual vaginal bleeding -yellowing of the eyes or skin Side effects that usually do not require medical attention (report to your doctor or health care professional if they continue or are bothersome): -hair loss -increased hunger or thirst -increased urination -symptoms of vaginal infection like itching, irritation or unusual discharge -unusually weak or tired This list may not describe all possible side effects. Call your doctor for medical advice about side effects. You may report side effects to FDA at 1-800-FDA-1088. Where should I keep my medicine? Keep out of the reach of children. Store at room temperature between 15 and 30 degrees C (59 and 86 degrees F). Protect from temperatures above 40 degrees C (104 degrees C). Do not freeze. Throw away any unused medicine after the expiration date. NOTE: This sheet is a summary. It may not cover all possible information. If you have questions  about this medicine, talk to your doctor, pharmacist, or health care provider.    2016, Elsevier/Gold Standard. (2010-07-24 09:18:12)  Bacterial Vaginosis Bacterial vaginosis is a vaginal infection that occurs when the normal balance of bacteria in the vagina is disrupted. It results from an overgrowth of certain bacteria. This is the most common vaginal infection in women of childbearing age. Treatment is important to prevent complications, especially in pregnant women, as it can cause a premature delivery. CAUSES  Bacterial vaginosis is caused by an increase in harmful bacteria that are normally present in smaller amounts in the vagina. Several different kinds of bacteria can cause bacterial vaginosis. However, the reason that the condition develops is not fully understood. RISK FACTORS Certain activities or behaviors can put you at an increased risk of developing bacterial vaginosis, including:  Having a new sex partner or multiple sex partners.  Douching.  Using an intrauterine device (IUD) for contraception. Women do not get bacterial vaginosis from toilet seats, bedding, swimming pools, or contact with objects around them. SIGNS AND SYMPTOMS  Some women with bacterial vaginosis have no signs or symptoms. Common symptoms include:  Grey vaginal discharge.  A fishlike odor with discharge, especially after sexual intercourse.  Itching or burning of the vagina and vulva.  Burning or pain with urination. DIAGNOSIS  Your health care provider will take a medical history and examine the vagina for signs of bacterial vaginosis. A sample of vaginal fluid may be taken. Your health care provider will look at this sample under a microscope to check for bacteria and abnormal cells. A vaginal pH test may also be done.  TREATMENT  Bacterial vaginosis may be treated with antibiotic medicines. These may be given in the form of a pill or a vaginal cream. A second round of antibiotics may be  prescribed if the condition comes back after treatment. Because bacterial vaginosis increases your risk for sexually transmitted diseases, getting treated can help reduce your risk for chlamydia, gonorrhea, HIV, and herpes. HOME CARE INSTRUCTIONS   Only take over-the-counter or prescription medicines as directed by your health care provider.  If antibiotic medicine was prescribed, take it as directed. Make sure you finish it even if you start to feel better.  Tell all sexual partners that you have a vaginal infection. They should see their health care provider and be treated if they have problems, such as a mild rash or itching.  During treatment, it is important that you follow these instructions:  Avoid sexual activity or use condoms correctly.  Do not douche.  Avoid alcohol as directed by your health care provider.  Avoid breastfeeding as directed by your health care provider. SEEK MEDICAL CARE IF:   Your symptoms are not improving after 3 days of treatment.  You have increased discharge or pain.  You have a fever. MAKE SURE YOU:   Understand these instructions.  Will watch your condition.  Will get help right away if you are not doing well or get worse. FOR MORE INFORMATION  Centers for Disease Control and Prevention, Division of STD Prevention: AppraiserFraud.fi American Sexual Health Association (ASHA): www.ashastd.org    This information is not intended to replace advice given to you by your health care provider. Make sure you discuss any questions you have with your health care provider.   Document Released: 04/21/2005 Document Revised: 05/12/2014 Document Reviewed: 12/01/2012 Elsevier Interactive Patient Education [employer].

## 2015-06-28 ENCOUNTER — Telehealth: Payer: Self-pay

## 2015-06-28 ENCOUNTER — Other Ambulatory Visit: Payer: Self-pay | Admitting: Obstetrics and Gynecology

## 2015-06-28 DIAGNOSIS — D72819 Decreased white blood cell count, unspecified: Secondary | ICD-10-CM

## 2015-06-28 LAB — WET PREP BY MOLECULAR PROBE
Candida species: POSITIVE — AB
Gardnerella vaginalis: POSITIVE — AB
Trichomonas vaginosis: NEGATIVE

## 2015-06-28 LAB — HEMOGLOBIN, FINGERSTICK: HEMOGLOBIN, FINGERSTICK: 13.4 g/dL (ref 12.0–16.0)

## 2015-06-28 MED ORDER — FLUCONAZOLE 150 MG PO TABS: 150.0000 mg | ORAL_TABLET | Freq: Once | ORAL | Status: DC

## 2015-06-28 MED ORDER — METRONIDAZOLE 500 MG PO TABS: 500.0000 mg | ORAL_TABLET | Freq: Two times a day (BID) | ORAL | Status: DC

## 2015-06-28 NOTE — Telephone Encounter (Signed)
-----   Message from Nunzio Cobbs, MD sent at 06/28/2015 10:24 AM EST ----- Results to patient through My Chart. Please contact the patient to be sure she receives this message and makes a lab follow up appointment in 6 weeks.   Thanks!  "Hello Nicole Shelton,   I am sharing your lab work with you.   The vaginitis test did show yeast and bacterial vaginosis.  I will be happy to send a prescription through to your pharmacy for Flagyl for the bacterial vaginosis and Diflucan for the yeast infection.   The cholesterol panel showed elevated LDL cholesterol.  This can be lowered through a low cholesterol diet and exercise.   The blood chemistries were normal.   Your white blood count was a little low, and I do recommend repeating this in 6 weeks.  Please make a lab appointment.   The nurse will call in follow up to this message.  Thank you!  Josefa Half, MD"  Cc- Marisa Sprinkles

## 2015-06-28 NOTE — Telephone Encounter (Signed)
Spoke with patient. Verified with patient that she received mychart message from [facility]. States she received this message this morning. Reviewed results with patient. She verbalizes understanding. 6 week lab recheck scheduled for 08/09/2015 at 8:30 am. She is agreeable to date and time.  Routing to provider for final review. Patient agreeable to disposition. Will close encounter.

## 2015-06-29 LAB — IPS PAP TEST WITH HPV

## 2015-08-09 ENCOUNTER — Other Ambulatory Visit (INDEPENDENT_AMBULATORY_CARE_PROVIDER_SITE_OTHER): Payer: BLUE CROSS/BLUE SHIELD

## 2015-08-09 DIAGNOSIS — D72819 Decreased white blood cell count, unspecified: Secondary | ICD-10-CM

## 2015-08-09 LAB — CBC WITH DIFFERENTIAL/PLATELET
BASOS PCT: 0 %
Basophils Absolute: 0 cells/uL (ref 0–200)
EOS ABS: 0 {cells}/uL — AB (ref 15–500)
Eosinophils Relative: 0 %
HCT: 38.7 % (ref 35.0–45.0)
Hemoglobin: 13.3 g/dL (ref 11.7–15.5)
LYMPHS ABS: 1054 {cells}/uL (ref 850–3900)
Lymphocytes Relative: 31 %
MCH: 28.8 pg (ref 27.0–33.0)
MCHC: 34.4 g/dL (ref 32.0–36.0)
MCV: 83.8 fL (ref 80.0–100.0)
MONOS PCT: 9 %
MPV: 8.9 fL (ref 7.5–12.5)
Monocytes Absolute: 306 cells/uL (ref 200–950)
NEUTROS ABS: 2040 {cells}/uL (ref 1500–7800)
Neutrophils Relative %: 60 %
PLATELETS: 234 10*3/uL (ref 140–400)
RBC: 4.62 MIL/uL (ref 3.80–5.10)
RDW: 13.9 % (ref 11.0–15.0)
WBC: 3.4 10*3/uL — ABNORMAL LOW (ref 3.8–10.8)

## 2015-08-13 ENCOUNTER — Telehealth: Payer: Self-pay | Admitting: *Deleted

## 2015-08-13 NOTE — Telephone Encounter (Signed)
-----   Message from Salvadore Dom, MD sent at 08/09/2015  6:05 PM EDT ----- Please let the patient know that her WBC is still low. She needs to f/u with her primary MD. Please send a copy of her labs to her primary.

## 2015-08-13 NOTE — Telephone Encounter (Signed)
Call to patient Left message to call back. Ask for triage nurse. 

## 2015-08-14 NOTE — Telephone Encounter (Signed)
Patient notified see result note 

## 2015-08-31 ENCOUNTER — Ambulatory Visit (INDEPENDENT_AMBULATORY_CARE_PROVIDER_SITE_OTHER): Payer: BLUE CROSS/BLUE SHIELD | Admitting: Family Medicine

## 2015-08-31 ENCOUNTER — Encounter: Payer: Self-pay | Admitting: Family Medicine

## 2015-08-31 VITALS — BP 112/68 | HR 58 | Temp 98.1°F | Ht 66.5 in | Wt 158.5 lb

## 2015-08-31 DIAGNOSIS — D72819 Decreased white blood cell count, unspecified: Secondary | ICD-10-CM

## 2015-08-31 NOTE — Patient Instructions (Signed)
Labs look ok  You have a baseline low-normal white blood cell count-we will watch that over time  If you develop new symptoms let me know (or Sjogren's symptoms), or if you find yourself getting a lot of colds or other illnesses  Take care of yourself

## 2015-08-31 NOTE — Progress Notes (Signed)
Subjective:    Patient ID: Nicole Shelton, female    DOB: [birth year], 50 y.o.   MRN: [id number]  HPI Here for f/u of labs from gyn  Results for orders placed or performed in visit on 08/09/15  CBC with Differential/Platelet  Result Value Ref Range   WBC 3.4 (L) 3.8 - 10.8 K/uL   RBC 4.62 3.80 - 5.10 MIL/uL   Hemoglobin 13.3 11.7 - 15.5 g/dL   HCT 38.7 35.0 - 45.0 %   MCV 83.8 80.0 - 100.0 fL   MCH 28.8 27.0 - 33.0 pg   MCHC 34.4 32.0 - 36.0 g/dL   RDW 13.9 11.0 - 15.0 %   Platelets 234 140 - 400 K/uL   MPV 8.9 7.5 - 12.5 fL   Neutro Abs 2040 1500 - 7800 cells/uL   Lymphs Abs 1054 850 - 3900 cells/uL   Monocytes Absolute 306 200 - 950 cells/uL   Eosinophils Absolute 0 (L) 15 - 500 cells/uL   Basophils Absolute 0 0 - 200 cells/uL   Neutrophils Relative % 60 %   Lymphocytes Relative 31 %   Monocytes Relative 9 %   Eosinophils Relative 0 %   Basophils Relative 0 %   Smear Review Criteria for review not met     Prior to this wbc was 3.6 and before that in low 4s  Has always been on the low side   No blood disorders in the family   She had a lip bx by ENT -in the past for dry mouth and eyes ? Sjogrens Never bad enough to do anything about  No longer had swollen LN - had them at one time   Feeling fine with no problems   Not on any medicines    Patient Active Problem List   Diagnosis Date Noted  . Leukopenia 08/31/2015  . Adenomatous colon polyp 03/02/2014  . Abdominal bloating 03/02/2014  . Gallstones 11/03/2012  . Routine general medical examination at a health care facility 10/06/2011  . HYPERLIPIDEMIA 04/11/2008  . IRRITABLE BOWEL SYNDROME 04/11/2008   Past Medical History  Diagnosis Date  . History of chicken pox   . Seasonal allergies   . Hyperlipemia   . History of recurrent UTIs   . IBS (irritable bowel syndrome)   . Urinary incontinence     with laughing,coughing,sneezing  . GERD (gastroesophageal reflux disease)   . Gallstones 2014  .  Migraines     with aura.     Past Surgical History  Procedure Laterality Date  . Dilation and curettage of uterus    . Cholecystectomy  2014  . Mandible surgery     Social History  Substance Use Topics  . Smoking status: Never Smoker   . Smokeless tobacco: Never Used  . Alcohol Use: No   Family History  Problem Relation Age of Onset  . Hyperlipidemia Mother     on medication recently  . Irritable bowel syndrome Mother   . Pulmonary fibrosis Father     double lung transplant  . Heart disease Maternal Grandfather   . Bladder Cancer Maternal Grandmother   . Osteoarthritis Paternal Grandmother   . Osteoarthritis Paternal Grandfather   . Colon cancer Neg Hx    Allergies  Allergen Reactions  . Amoxicillin Other (See Comments)    REACTION: back, leg pain  . Cetirizine Hcl     REACTION: sedation  . Claritin [Loratadine]     REACTION: sedation  . Zyrtec Allergy [Cetirizine Hcl]  Current Outpatient Prescriptions on File Prior to Visit  Medication Sig Dispense Refill  . Ibuprofen (ADVIL PO) Take by mouth as needed.    Marland Kitchen PREVIDENT 5000 SENSITIVE 1.1-5 % PSTE Take 1 application by mouth daily.  3   No current facility-administered medications on file prior to visit.    Review of Systems Review of Systems  Constitutional: Negative for fever, appetite change, fatigue and unexpected weight change.  Eyes: Negative for pain and visual disturbance.  ENT pos for dry mouth and eyes which are stable Respiratory: Negative for cough and shortness of breath.   Cardiovascular: Negative for cp or palpitations    Gastrointestinal: Negative for nausea, diarrhea and constipation.  Genitourinary: Negative for urgency and frequency.  Skin: Negative for pallor or rash   Neurological: Negative for weakness, light-headedness, numbness and headaches.  Hematological: Negative for adenopathy. Does not bruise/bleed easily.  Psychiatric/Behavioral: Negative for dysphoric mood. The patient is not  nervous/anxious.         Objective:   Physical Exam  Constitutional: She appears well-developed and well-nourished. No distress.  Well appearing   HENT:  Head: Normocephalic and atraumatic.  Right Ear: External ear normal.  Left Ear: External ear normal.  Nose: Nose normal.  Mouth/Throat: Oropharynx is clear and moist.  Eyes: Conjunctivae and EOM are normal. Pupils are equal, round, and reactive to light. Right eye exhibits no discharge. Left eye exhibits no discharge. No scleral icterus.  Neck: Normal range of motion. Neck supple. No JVD present. Carotid bruit is not present. No thyromegaly present.  Cardiovascular: Normal rate, regular rhythm, normal heart sounds and intact distal pulses.  Exam reveals no gallop.   Pulmonary/Chest: Effort normal and breath sounds normal. No respiratory distress. She has no wheezes. She has no rales.  Abdominal: Soft. Bowel sounds are normal. She exhibits no distension, no abdominal bruit, no pulsatile midline mass and no mass. There is no hepatosplenomegaly. There is no tenderness.  Musculoskeletal: She exhibits no edema or tenderness.  No acute joint changes   Lymphadenopathy:    She has no cervical adenopathy.  Neurological: She is alert. She has normal reflexes. No cranial nerve deficit. She exhibits normal muscle tone. Coordination normal.  Skin: Skin is warm and dry. No rash noted. No erythema. No pallor.  Psychiatric: She has a normal mood and affect.          Assessment & Plan:   Problem List Items Addressed This Visit      Other   Leukopenia - Primary    Mild and chronic  Pt has poss dx of mild Sjogren's -this could affect it  No symptoms/ no recent viruses Rev last several wbc  Nl exam  Reassured-will watch over time  Disc watching for s/s of worsened autoimmune problems or frequent illnesses

## 2015-08-31 NOTE — Progress Notes (Signed)
Pre visit review using our clinic review tool, if applicable. No additional management support is needed unless otherwise documented below in the visit note. 

## 2015-09-02 NOTE — Assessment & Plan Note (Signed)
Mild and chronic  Pt has poss dx of mild Sjogren's -this could affect it  No symptoms/ no recent viruses Rev last several wbc  Nl exam  Reassured-will watch over time  Disc watching for s/s of worsened autoimmune problems or frequent illnesses

## 2016-07-16 NOTE — Progress Notes (Signed)
51 y.o. G80P1011 Married Caucasian female here for annual exam.    Menses are regular.  No problems.   Incontinence no change. Not a problem for patient.  [place] on her right knee in Jan.  Still sore but improving.  Able to walk and bend.  Does blood work with PCP.   PCP:   Nicole Pardon, MD  Patient's last menstrual period was 07/10/2016.     Period Cycle (Days): 30 Period Duration (Days): 3 Period Pattern: Regular Menstrual Flow:  (1st day heavy then tapers) Menstrual Control: Maxi pad Menstrual Control Change Freq (Hours): every 5-6 hours on heaviest day Dysmenorrhea: (!) Moderate Dysmenorrhea Symptoms: Cramping     Sexually active: Yes.   female The current method of family planning is vasectomy.    Exercising: No.  occ. walking Smoker:  no  Health Maintenance: Pap:  06/27/15 Neg:Neg HR HPV;06-22-12 Neg:Neg HR HPV History of abnormal Pap:  no MMG:  06/16/16 Breast density cat C, BIRADS 1 negative  Colonoscopy: 01-03-14 Adenomatous polyp with Dr. Leticia Penna due 01/2019. BMD:   n/.a  Result  n/a TDaP:  10/08/11 Gardasil:   no HIV:  Tested in pregnancy.    Screening Labs:  Hb today: PCP, Urine today: not done   reports that she has never smoked. She has never used smokeless tobacco. She reports that she does not drink alcohol or use drugs.  Past Medical History:  Diagnosis Date  . Gallstones 2014  . GERD (gastroesophageal reflux disease)   . History of chicken pox   . History of recurrent UTIs   . Hyperlipemia   . IBS (irritable bowel syndrome)   . Migraines    with aura.    . Seasonal allergies   . Urinary incontinence    with laughing,coughing,sneezing    Past Surgical History:  Procedure Laterality Date  . CHOLECYSTECTOMY  2014  . DILATION AND CURETTAGE OF UTERUS    . MANDIBLE SURGERY      Current Outpatient Prescriptions  Medication Sig Dispense Refill  . Ibuprofen (ADVIL PO) Take by mouth as needed.    Marland Kitchen PREVIDENT 5000 SENSITIVE 1.1-5 % PSTE Take 1  application by mouth daily.  3   No current facility-administered medications for this visit.     Family History  Problem Relation Age of Onset  . Hyperlipidemia Mother     on medication recently  . Irritable bowel syndrome Mother   . Pulmonary fibrosis Father     double lung transplant  . Heart disease Maternal Grandfather   . Bladder Cancer Maternal Grandmother   . Osteoarthritis Paternal Grandmother   . Osteoarthritis Paternal Grandfather   . Colon cancer Neg Hx     ROS:  Pertinent items are noted in HPI.  Otherwise, a comprehensive ROS was negative.  Exam:   BP 102/68 (BP Location: Right Arm, Patient Position: Sitting, Cuff Size: Normal)   Pulse (!) 56   Resp 14   Ht 5\' 7"  (1.702 m)   Wt 159 lb 9.6 oz (72.4 kg)   LMP 07/10/2016   BMI 25.00 kg/m     General appearance: alert, cooperative and appears stated age Head: Normocephalic, without obvious abnormality, atraumatic Neck: no adenopathy, supple, symmetrical, trachea midline and thyroid normal to inspection and palpation Lungs: clear to auscultation bilaterally Breasts: normal appearance, no masses or tenderness, No nipple retraction or dimpling, No nipple discharge or bleeding, No axillary or supraclavicular adenopathy Heart: regular rate and rhythm Abdomen: soft, non-tender; no masses, no organomegaly Extremities: extremities  normal, atraumatic, no cyanosis or edema Skin: Skin color, texture, turgor normal. No rashes or lesions Lymph nodes: Cervical, supraclavicular, and axillary nodes normal. No abnormal inguinal nodes palpated Neurologic: Grossly normal  Pelvic: External genitalia:  no lesions              Urethra:  normal appearing urethra with no masses, tenderness or lesions              Bartholins and Skenes: normal                 Vagina: normal appearing vagina with normal color and discharge, no lesions              Cervix: no lesions              Pap taken: No. Bimanual Exam:  Uterus:  normal size,  contour, position, consistency, mobility, non-tender              Adnexa: no mass, fullness, tenderness              Rectal exam: Yes.  .  Confirms.  Small left rectal hemorrhoid, nontender.              Anus:  normal sphincter tone, no lesions  Chaperone was present for exam.  Assessment:   Well woman visit with normal exam. Hypertriglyceridemia.  Hx migraine with aura.  Genuine stress incontinence.  Right knee pain status post fall.   Plan: Mammogram screening discussed. Recommended self breast awareness. Pap and HR HPV as above. Guidelines for Calcium, Vitamin D, regular exercise program including cardiovascular and weight bearing exercise. To PCP for further evaluation of knee.  Labs with PCP. Follow up annually and prn.       After visit summary provided.

## 2016-07-18 ENCOUNTER — Ambulatory Visit (INDEPENDENT_AMBULATORY_CARE_PROVIDER_SITE_OTHER): Payer: 59 | Admitting: Obstetrics and Gynecology

## 2016-07-18 ENCOUNTER — Encounter: Payer: Self-pay | Admitting: Obstetrics and Gynecology

## 2016-07-18 VITALS — BP 102/68 | HR 56 | Resp 14 | Ht 67.0 in | Wt 159.6 lb

## 2016-07-18 DIAGNOSIS — Z01419 Encounter for gynecological examination (general) (routine) without abnormal findings: Secondary | ICD-10-CM | POA: Diagnosis not present

## 2016-07-18 NOTE — Patient Instructions (Signed)

## 2016-08-17 ENCOUNTER — Telehealth: Payer: Self-pay | Admitting: Family Medicine

## 2016-08-17 DIAGNOSIS — Z0000 Encounter for general adult medical examination without abnormal findings: Secondary | ICD-10-CM

## 2016-08-17 NOTE — Telephone Encounter (Signed)
-----   Message from Ellamae Sia sent at 08/14/2016  4:48 PM EDT ----- Regarding: Lab orders for Thursday, 4.26.18 Patient is scheduled for CPX labs, please order future labs, Thanks , Karna Christmas

## 2016-08-28 ENCOUNTER — Other Ambulatory Visit (INDEPENDENT_AMBULATORY_CARE_PROVIDER_SITE_OTHER): Payer: 59

## 2016-08-28 DIAGNOSIS — Z0000 Encounter for general adult medical examination without abnormal findings: Secondary | ICD-10-CM | POA: Diagnosis not present

## 2016-08-28 LAB — LIPID PANEL
CHOLESTEROL: 197 mg/dL (ref 0–200)
HDL: 41.5 mg/dL (ref >39.00)
LDL Cholesterol: 137 mg/dL — ABNORMAL HIGH (ref 0–99)
NONHDL: 155.73
Total CHOL/HDL Ratio: 5
Triglycerides: 93 mg/dL (ref 0.0–149.0)
VLDL: 18.6 mg/dL (ref 0.0–40.0)

## 2016-08-28 LAB — CBC WITH DIFFERENTIAL/PLATELET
BASOS ABS: 0 10*3/uL (ref 0.0–0.1)
BASOS PCT: 1.2 % (ref 0.0–3.0)
Eosinophils Absolute: 0 10*3/uL (ref 0.0–0.7)
Eosinophils Relative: 0 % (ref 0.0–5.0)
HEMATOCRIT: 38.8 % (ref 36.0–46.0)
HEMOGLOBIN: 13.4 g/dL (ref 12.0–15.0)
LYMPHS PCT: 24.3 % (ref 12.0–46.0)
Lymphs Abs: 1 10*3/uL (ref 0.7–4.0)
MCHC: 34.6 g/dL (ref 30.0–36.0)
MCV: 85 fl (ref 78.0–100.0)
Monocytes Absolute: 0.4 10*3/uL (ref 0.1–1.0)
Monocytes Relative: 9.5 % (ref 3.0–12.0)
Neutro Abs: 2.8 10*3/uL (ref 1.4–7.7)
Neutrophils Relative %: 65 % (ref 43.0–77.0)
Platelets: 294 10*3/uL (ref 150.0–400.0)
RBC: 4.56 Mil/uL (ref 3.87–5.11)
RDW: 12.7 % (ref 11.5–15.5)
WBC: 4.2 10*3/uL (ref 4.0–10.5)

## 2016-08-28 LAB — COMPREHENSIVE METABOLIC PANEL
ALBUMIN: 4 g/dL (ref 3.5–5.2)
ALK PHOS: 35 U/L — AB (ref 39–117)
ALT: 13 U/L (ref 0–35)
AST: 17 U/L (ref 0–37)
BILIRUBIN TOTAL: 0.6 mg/dL (ref 0.2–1.2)
BUN: 18 mg/dL (ref 6–23)
CALCIUM: 8.8 mg/dL (ref 8.4–10.5)
CO2: 24 mEq/L (ref 19–32)
CREATININE: 0.76 mg/dL (ref 0.40–1.20)
Chloride: 108 mEq/L (ref 96–112)
GFR: 86.16 mL/min (ref >60.00)
Glucose, Bld: 78 mg/dL (ref 70–99)
Potassium: 4.1 mEq/L (ref 3.5–5.1)
Sodium: 137 mEq/L (ref 135–145)
TOTAL PROTEIN: 7.4 g/dL (ref 6.0–8.3)

## 2016-08-28 LAB — TSH: TSH: 1.38 u[IU]/mL (ref 0.35–4.50)

## 2016-09-02 ENCOUNTER — Encounter: Payer: Self-pay | Admitting: Family Medicine

## 2016-09-02 ENCOUNTER — Ambulatory Visit (INDEPENDENT_AMBULATORY_CARE_PROVIDER_SITE_OTHER): Payer: 59 | Admitting: Family Medicine

## 2016-09-02 VITALS — BP 104/68 | HR 74 | Temp 98.2°F | Ht 66.5 in | Wt 158.2 lb

## 2016-09-02 DIAGNOSIS — Z0000 Encounter for general adult medical examination without abnormal findings: Secondary | ICD-10-CM

## 2016-09-02 DIAGNOSIS — E7800 Pure hypercholesterolemia, unspecified: Secondary | ICD-10-CM | POA: Diagnosis not present

## 2016-09-02 NOTE — Patient Instructions (Addendum)
For cholesterol : Avoid red meat/ fried foods/ egg yolks/ fatty breakfast meats/ butter, cheese and high fat dairy/ and shellfish   Also exercise helps increase HDL (good cholesterol)   Take care of yourself  If you want to see orthopedics for your knee in the future let us know  Use ice when needed Not surprised that you are still having discomfort   Labs are stable   Stay active-try to find some time for exercise (in a perfect world 30 or more minutes 5 days per week)

## 2016-09-02 NOTE — Progress Notes (Signed)
Subjective:    Patient ID: Nicole Shelton, female    DOB: April 03, 1966, 51 y.o.   MRN: [id number]  HPI  Here for health maintenance exam and to review chronic medical problems    Feeling good  Working -not a lot going on  Trying to help her mother with her stepfather with alz   Wt Readings from Last 3 Encounters:  09/02/16 158 lb 4 oz (71.8 kg)  07/18/16 159 lb 9.6 oz (72.4 kg)  08/31/15 158 lb 8 oz (71.9 kg)  eating healthy  Taking care of herself Not a lot of time for exercise  bmi 25.1  HIV screen- she was screened when pregnant   Flu shot -this season at work  Tetanus shot 6/13  Mammogram 2/18-nl Self breast exam - no lumps   Pap 2/17 neg with neg hpv Dr Quincy Simmonds Recall 2 y  This year at her visit did not get a pap    Colonoscopy 9/15  Adenoma/ recall 2020  Hx of hyperlipidemia Lab Results  Component Value Date   CHOL 197 08/28/2016   CHOL 209 (H) 06/27/2015   CHOL 215 (H) 06/22/2014   Lab Results  Component Value Date   HDL 41.50 08/28/2016   HDL 47 06/27/2015   HDL 44 06/22/2014   Lab Results  Component Value Date   LDLCALC 137 (H) 08/28/2016   LDLCALC 144 (H) 06/27/2015   LDLCALC 133 (H) 06/22/2014   Lab Results  Component Value Date   TRIG 93.0 08/28/2016   TRIG 89 06/27/2015   TRIG 189 (H) 06/22/2014   Lab Results  Component Value Date   CHOLHDL 5 08/28/2016   CHOLHDL 4.4 06/27/2015   CHOLHDL 4.9 06/22/2014   Lab Results  Component Value Date   LDLDIRECT 139.5 10/06/2011   LDLDIRECT 149.0 11/27/2008   LDLDIRECT 144.0 08/17/2008   She does not eat red meat or shellfish  Some cheese  occ cooks with butter  No bacon/ sausage  Does not eat fried foods as a rule  No biscuits   Results for orders placed or performed in visit on 08/28/16  CBC with Differential/Platelet  Result Value Ref Range   WBC 4.2 4.0 - 10.5 K/uL   RBC 4.56 3.87 - 5.11 Mil/uL   Hemoglobin 13.4 12.0 - 15.0 g/dL   HCT 38.8 36.0 - 46.0 %   MCV 85.0 78.0 -  100.0 fl   MCHC 34.6 30.0 - 36.0 g/dL   RDW 12.7 11.5 - 15.5 %   Platelets 294.0 150.0 - 400.0 K/uL   Neutrophils Relative % 65.0 43.0 - 77.0 %   Lymphocytes Relative 24.3 12.0 - 46.0 %   Monocytes Relative 9.5 3.0 - 12.0 %   Eosinophils Relative 0.0 0.0 - 5.0 %   Basophils Relative 1.2 0.0 - 3.0 %   Neutro Abs 2.8 1.4 - 7.7 K/uL   Lymphs Abs 1.0 0.7 - 4.0 K/uL   Monocytes Absolute 0.4 0.1 - 1.0 K/uL   Eosinophils Absolute 0.0 0.0 - 0.7 K/uL   Basophils Absolute 0.0 0.0 - 0.1 K/uL  Comprehensive metabolic panel  Result Value Ref Range   Sodium 137 135 - 145 mEq/L   Potassium 4.1 3.5 - 5.1 mEq/L   Chloride 108 96 - 112 mEq/L   CO2 24 19 - 32 mEq/L   Glucose, Bld 78 70 - 99 mg/dL   BUN 18 6 - 23 mg/dL   Creatinine, Ser 0.76 0.40 - 1.20 mg/dL   Total Bilirubin 0.6  0.2 - 1.2 mg/dL   Alkaline Phosphatase 35 (L) 39 - 117 U/L   AST 17 0 - 37 U/L   ALT 13 0 - 35 U/L   Total Protein 7.4 6.0 - 8.3 g/dL   Albumin 4.0 3.5 - 5.2 g/dL   Calcium 8.8 8.4 - 10.5 mg/dL   GFR 86.16 >60.00 mL/min  TSH  Result Value Ref Range   TSH 1.38 0.35 - 4.50 uIU/mL  Lipid panel  Result Value Ref Range   Cholesterol 197 0 - 200 mg/dL   Triglycerides 93.0 0.0 - 149.0 mg/dL   HDL 41.50 >39.00 mg/dL   VLDL 18.6 0.0 - 40.0 mg/dL   LDL Cholesterol 137 (H) 0 - 99 mg/dL   Total CHOL/HDL Ratio 5    NonHDL 155.73      Review of Systems Review of Systems  Constitutional: Negative for fever, appetite change, fatigue and unexpected weight change.  Eyes: Negative for pain and visual disturbance.  Respiratory: Negative for cough and shortness of breath.   Cardiovascular: Negative for cp or palpitations    Gastrointestinal: Negative for nausea, diarrhea and constipation.  Genitourinary: Negative for urgency and frequency.  Skin: Negative for pallor or rash   MSK pos for R knee soreness after a fall in January  Neurological: Negative for weakness, light-headedness, numbness and headaches.  Hematological:  Negative for adenopathy. Does not bruise/bleed easily.  Psychiatric/Behavioral: Negative for dysphoric mood. The patient is not nervous/anxious.         Objective:   Physical Exam  Constitutional: She appears well-developed and well-nourished. No distress.  Well appearing   HENT:  Head: Normocephalic and atraumatic.  Right Ear: External ear normal.  Left Ear: External ear normal.  Nose: Nose normal.  Mouth/Throat: Oropharynx is clear and moist.  Eyes: Conjunctivae and EOM are normal. Pupils are equal, round, and reactive to light. Right eye exhibits no discharge. Left eye exhibits no discharge. No scleral icterus.  Neck: Normal range of motion. Neck supple. No JVD present. Carotid bruit is not present. No thyromegaly present.  Cardiovascular: Normal rate, regular rhythm, normal heart sounds and intact distal pulses.  Exam reveals no gallop.   Pulmonary/Chest: Effort normal and breath sounds normal. No respiratory distress. She has no wheezes. She has no rales.  Abdominal: Soft. Bowel sounds are normal. She exhibits no distension and no mass. There is no tenderness.  Genitourinary:  Genitourinary Comments: Gyn exam done by her gynecologist   Musculoskeletal: She exhibits no edema or tenderness.  Lymphadenopathy:    She has no cervical adenopathy.  Neurological: She is alert. She has normal reflexes. No cranial nerve deficit. She exhibits normal muscle tone. Coordination normal.  Skin: Skin is warm and dry. No rash noted. No erythema. No pallor.  Some lentigines and brown nevi   Psychiatric: She has a normal mood and affect.          Assessment & Plan:   Problem List Items Addressed This Visit      Other   Hyperlipidemia    Disc goals for lipids and reasons to control them Rev labs with pt Rev low sat fat diet in detail LDL goal is less than 130 if possible  Disc sat/trans fats in diet       Routine general medical examination at a health care facility - Primary     Reviewed health habits including diet and exercise and skin cancer prevention Reviewed appropriate screening tests for age  Also reviewed health mt list, fam  hx and immunization status , as well as social and family history    Sees Dr Quincy Simmonds for gyn  utd cancer screening  Cholesterol rev- recommend work on diet  Wellness labs rev  Enc healthy diet and exercise

## 2016-09-02 NOTE — Assessment & Plan Note (Signed)
Reviewed health habits including diet and exercise and skin cancer prevention Reviewed appropriate screening tests for age  Also reviewed health mt list, fam hx and immunization status , as well as social and family history    Sees Dr Quincy Simmonds for gyn  utd cancer screening  Cholesterol rev- recommend work on [employer] labs rev  [employer] and exercise

## 2016-09-02 NOTE — Progress Notes (Signed)
Pre visit review using our clinic review tool, if applicable. No additional management support is needed unless otherwise documented below in the visit note. 

## 2016-09-02 NOTE — Assessment & Plan Note (Signed)
Disc goals for lipids and reasons to control them Rev labs with pt Rev low sat fat diet in detail LDL goal is less than 130 if possible  Disc sat/trans fats in diet

## 2017-02-03 ENCOUNTER — Ambulatory Visit (INDEPENDENT_AMBULATORY_CARE_PROVIDER_SITE_OTHER): Payer: 59 | Admitting: Family Medicine

## 2017-02-03 ENCOUNTER — Encounter: Payer: Self-pay | Admitting: Family Medicine

## 2017-02-03 VITALS — BP 122/84 | HR 79 | Temp 97.7°F | Ht 66.5 in | Wt 162.0 lb

## 2017-02-03 DIAGNOSIS — R1013 Epigastric pain: Secondary | ICD-10-CM

## 2017-02-03 DIAGNOSIS — K580 Irritable bowel syndrome with diarrhea: Secondary | ICD-10-CM | POA: Diagnosis not present

## 2017-02-03 MED ORDER — PANCRELIPASE (LIP-PROT-AMYL) 12000-38000 UNITS PO CPEP: 12000.0000 [IU] | ORAL_CAPSULE | Freq: Three times a day (TID) | ORAL | 3 refills | Status: DC

## 2017-02-03 NOTE — Assessment & Plan Note (Signed)
May be related to prev IBS or gastritis  She will first try Creon which helped in the past  If no imp - try tums and ppi (prilosec) Disc diet=avoid spicy and acidic foods and nsaids Update if not starting to improve in a week or if worsening

## 2017-02-03 NOTE — Assessment & Plan Note (Signed)
Worse lately with recent menses  Diarrhea- calming down/ still bloating and abd discomfort  Will try creon with meals- has helped greatly in the past  Fluid/fiber intake discussed/bland food for now  Update if not imp

## 2017-02-03 NOTE — Progress Notes (Signed)
Subjective:    Patient ID: Nicole Shelton, female    DOB: 09-23-1965, 51 y.o.   MRN: [id number]  HPI Here with GI complaints   worse  Colonoscopy 9/15 with a polyp  Recall is 2020   H/o IBS and gerd  ccy in the past  Was px rifaximin -no imp  Then creon (before meals)- much more helpful in 2016  Worse with menses (hers started a week ago) Epigastric burning sensation-no help with zantac  Diarrhea (finally improved today) Bloat with eating or drinking anything  Not on nsaids No recent abx  Tried immodium and pepto     Patient Active Problem List   Diagnosis Date Noted  . Epigastric burning sensation 02/03/2017  . Adenomatous colon polyp 03/02/2014  . Abdominal bloating 03/02/2014  . Routine general medical examination at a health care facility 10/06/2011  . Hyperlipidemia 04/11/2008  . IRRITABLE BOWEL SYNDROME 04/11/2008   Past Medical History:  Diagnosis Date  . Gallstones 2014  . GERD (gastroesophageal reflux disease)   . History of chicken pox   . History of recurrent UTIs   . Hyperlipemia   . IBS (irritable bowel syndrome)   . Migraines    with aura.    . Seasonal allergies   . Urinary incontinence    with laughing,coughing,sneezing   Past Surgical History:  Procedure Laterality Date  . CHOLECYSTECTOMY  2014  . DILATION AND CURETTAGE OF UTERUS    . MANDIBLE SURGERY     Social History  Substance Use Topics  . Smoking status: Never Smoker  . Smokeless tobacco: Never Used  . Alcohol use No   Family History  Problem Relation Age of Onset  . Hyperlipidemia Mother        on medication recently  . Irritable bowel syndrome Mother   . Pulmonary fibrosis Father        double lung transplant  . Heart disease Maternal Grandfather   . Bladder Cancer Maternal Grandmother   . Osteoarthritis Paternal Grandmother   . Osteoarthritis Paternal Grandfather   . Colon cancer Neg Hx    Allergies  Allergen Reactions  . Amoxicillin Other (See Comments)      REACTION: back, leg pain  . Cetirizine Hcl     REACTION: sedation  . Claritin [Loratadine]     REACTION: sedation  . Zyrtec Allergy [Cetirizine Hcl]    Current Outpatient Prescriptions on File Prior to Visit  Medication Sig Dispense Refill  . Ibuprofen (ADVIL PO) Take by mouth as needed.    Marland Kitchen PREVIDENT 5000 SENSITIVE 1.1-5 % PSTE Take 1 application by mouth daily.  3   No current facility-administered medications on file prior to visit.       Review of Systems  Constitutional: Negative for activity change, appetite change, fatigue, fever and unexpected weight change.  HENT: Negative for congestion, ear pain, rhinorrhea, sinus pressure and sore throat.   Eyes: Negative for pain, redness and visual disturbance.  Respiratory: Negative for cough, shortness of breath and wheezing.   Cardiovascular: Negative for chest pain and palpitations.  Gastrointestinal: Positive for abdominal distention, abdominal pain and diarrhea. Negative for anal bleeding, blood in stool, constipation and vomiting.  Endocrine: Negative for polydipsia and polyuria.  Genitourinary: Negative for dysuria, frequency and urgency.  Musculoskeletal: Negative for arthralgias, back pain and myalgias.  Skin: Negative for pallor and rash.  Allergic/Immunologic: Negative for environmental allergies.  Neurological: Negative for dizziness, syncope and headaches.  Hematological: Negative for adenopathy. Does not bruise/bleed easily.  Psychiatric/Behavioral: Negative for decreased concentration and dysphoric mood. The patient is not nervous/anxious.        Objective:   Physical Exam  Constitutional: She appears well-developed and well-nourished. No distress.  Well appearing   HENT:  Head: Normocephalic and atraumatic.  Mouth/Throat: Oropharynx is clear and moist.  Eyes: Pupils are equal, round, and reactive to light. Conjunctivae and EOM are normal. No scleral icterus.  Neck: Normal range of motion. Neck supple.   Cardiovascular: Normal rate, regular rhythm and normal heart sounds.   Pulmonary/Chest: Effort normal and breath sounds normal. No respiratory distress. She has no wheezes. She has no rales.  Abdominal: Soft. Bowel sounds are normal. She exhibits no distension and no mass. There is no hepatosplenomegaly. There is tenderness in the epigastric area and left upper quadrant. There is no rebound, no guarding and no CVA tenderness. No hernia.  Mild tenderness LUQ and epigastric area    Lymphadenopathy:    She has no cervical adenopathy.  Neurological: She is alert.  Skin: Skin is warm and dry. No rash noted. No erythema. No pallor.  Psychiatric: She has a normal mood and affect.          Assessment & Plan:   Problem List Items Addressed This Visit      Digestive   IRRITABLE BOWEL SYNDROME - Primary    Worse lately with recent menses  Diarrhea- calming down/ still bloating and abd discomfort  Will try creon with meals- has helped greatly in the past  Fluid/fiber intake discussed/bland food for now  Update if not imp       Relevant Medications   lipase/protease/amylase (CREON) 12000 units CPEP capsule     Other   Epigastric burning sensation    May be related to prev IBS or gastritis  She will first try Creon which helped in the past  If no imp - try tums and ppi (prilosec) Disc diet=avoid spicy and acidic foods and nsaids Update if not starting to improve in a week or if worsening

## 2017-02-03 NOTE — Patient Instructions (Signed)
Try to eat a bland diet  Drink lots of fluids to keep up with fluid losses from the diarrhea  Try the Creon again three times daily with meals  If upper abdominal burning persists - try tums or prilosec over the counter 20 mg daily   If diarrhea comes back or you get fever or worse abdominal pain let us know

## 2017-07-29 ENCOUNTER — Ambulatory Visit (INDEPENDENT_AMBULATORY_CARE_PROVIDER_SITE_OTHER): Payer: 59 | Admitting: Obstetrics and Gynecology

## 2017-07-29 ENCOUNTER — Encounter: Payer: Self-pay | Admitting: Obstetrics and Gynecology

## 2017-07-29 ENCOUNTER — Other Ambulatory Visit: Payer: Self-pay

## 2017-07-29 VITALS — BP 102/70 | HR 72 | Resp 16 | Ht 66.5 in | Wt 163.0 lb

## 2017-07-29 DIAGNOSIS — Z01419 Encounter for gynecological examination (general) (routine) without abnormal findings: Secondary | ICD-10-CM

## 2017-07-29 NOTE — Patient Instructions (Addendum)
EXERCISE AND DIET:  We recommended that you start or continue a regular exercise program for good health. Regular exercise means any activity that makes your heart beat faster and makes you sweat.  We recommend exercising at least 30 minutes per day at least 3 days a week, preferably 4 or 5.  We also recommend a diet low in fat and sugar.  Inactivity, poor dietary choices and obesity can cause diabetes, heart attack, stroke, and kidney damage, among others.    ALCOHOL AND SMOKING:  Women should limit their alcohol intake to no more than 7 drinks/beers/glasses of wine (combined, not each!) per week. Moderation of alcohol intake to this level decreases your risk of breast cancer and liver damage. And of course, no recreational drugs are part of a healthy lifestyle.  And absolutely no smoking or even second hand smoke. Most people know smoking can cause heart and lung diseases, but did you know it also contributes to weakening of your bones? Aging of your skin?  Yellowing of your teeth and nails?  CALCIUM AND VITAMIN D:  Adequate intake of calcium and Vitamin D are recommended.  The recommendations for exact amounts of these supplements seem to change often, but generally speaking 600 mg of calcium (either carbonate or citrate) and 800 units of Vitamin D per day seems prudent. Certain women may benefit from higher intake of Vitamin D.  If you are among these women, your doctor will have told you during your visit.    PAP SMEARS:  Pap smears, to check for cervical cancer or precancers,  have traditionally been done yearly, although recent scientific advances have shown that most women can have pap smears less often.  However, every woman still should have a physical exam from her gynecologist every year. It will include a breast check, inspection of the vulva and vagina to check for abnormal growths or skin changes, a visual exam of the cervix, and then an exam to evaluate the size and shape of the uterus and  ovaries.  And after 50 years of age, a rectal exam is indicated to check for rectal cancers. We will also provide age appropriate advice regarding health maintenance, like when you should have certain vaccines, screening for sexually transmitted diseases, bone density testing, colonoscopy, mammograms, etc.   MAMMOGRAMS:  All women over 40 years old should have a yearly mammogram. Many facilities now offer a "3D" mammogram, which may cost around $50 extra out of pocket. If possible,  we recommend you accept the option to have the 3D mammogram performed.  It both reduces the number of women who will be called back for extra views which then turn out to be normal, and it is better than the routine mammogram at detecting truly abnormal areas.    COLONOSCOPY:  Colonoscopy to screen for colon cancer is recommended for all women at age 50.  We know, you hate the idea of the prep.  We agree, BUT, having colon cancer and not knowing it is worse!!  Colon cancer so often starts as a polyp that can be seen and removed at colonscopy, which can quite literally save your life!  And if your first colonoscopy is normal and you have no family history of colon cancer, most women don't have to have it again for 10 years.  Once every ten years, you can do something that may end up saving your life, right?  We will be happy to help you get it scheduled when you are ready.    Be sure to check your insurance coverage so you understand how much it will cost.  It may be covered as a preventative service at no cost, but you should check your particular policy.      Kegel Exercises Kegel exercises help strengthen the muscles that support the rectum, vagina, small intestine, bladder, and uterus. Doing Kegel exercises can help:  Improve bladder and bowel control.  Improve sexual response.  Reduce problems and discomfort during pregnancy.  Kegel exercises involve squeezing your pelvic floor muscles, which are the same muscles you  squeeze when you try to stop the flow of urine. The exercises can be done while sitting, standing, or lying down, but it is best to vary your position. Phase 1 exercises 1. Squeeze your pelvic floor muscles tight. You should feel a tight lift in your rectal area. If you are a female, you should also feel a tightness in your vaginal area. Keep your stomach, buttocks, and legs relaxed. 2. Hold the muscles tight for up to 10 seconds. 3. Relax your muscles. Repeat this exercise 50 times a day or as many times as told by your health care provider. Continue to do this exercise for at least 4-6 weeks or for as long as told by your health care provider. This information is not intended to replace advice given to you by your health care provider. Make sure you discuss any questions you have with your health care provider. Document Released: 04/07/2012 Document Revised: 12/15/2015 Document Reviewed: 03/11/2015 Elsevier Interactive Patient Education  2018 [employer]  

## 2017-07-29 NOTE — Progress Notes (Addendum)
52 y.o. G21P1011 Married Caucasian female here for annual exam.    Menses regular.  No hot flashes.   Has some vaginal discharge, can be clear and it tapering off.  No odor, itching, or burning.  Does recur.   Still has some leakage of urine with exercise.  No change.  Declines tx.    Just laid off from work.   Will do labs with PCP in April.  PCP:  Dr. Rica Mote   Patient's last menstrual period was 07/17/2017.           Sexually active: Yes.    The current method of family planning is vasectomy.    Exercising: No.  The patient does not participate in regular exercise at present. Smoker:  no  Health Maintenance: Pap:  06/27/15 Neg:Neg HR HPV History of abnormal Pap:  no MMG:  06/18/17 BIRADS 1 negative/density c -- [facility] -- called for report Colonoscopy:  01-03-14 Adenomatous polyp with Dr. Leticia Penna due 01/2019 BMD:   n/a  Result  n/a TDaP:  10/08/11 Gardasil:   n/a HIV: negative in pregnancy Hep C: never Screening Labs:  PCP   reports that she has never smoked. She has never used smokeless tobacco. She reports that she does not drink alcohol or use drugs.  Past Medical History:  Diagnosis Date  . Gallstones 2014  . GERD (gastroesophageal reflux disease)   . History of chicken pox   . History of recurrent UTIs   . Hyperlipemia   . IBS (irritable bowel syndrome)   . Migraines    with aura.    . Seasonal allergies   . Urinary incontinence    with laughing,coughing,sneezing    Past Surgical History:  Procedure Laterality Date  . CHOLECYSTECTOMY  2014  . DILATION AND CURETTAGE OF UTERUS    . MANDIBLE SURGERY      Current Outpatient Medications  Medication Sig Dispense Refill  . Ibuprofen (ADVIL PO) Take by mouth as needed.    Marland Kitchen PREVIDENT 5000 SENSITIVE 1.1-5 % PSTE Take 1 application by mouth daily.  3   No current facility-administered medications for this visit.     Family History  Problem Relation Age of Onset  . Hyperlipidemia Mother        on  medication recently  . Irritable bowel syndrome Mother   . Pulmonary fibrosis Father        double lung transplant  . Heart disease Maternal Grandfather   . Bladder Cancer Maternal Grandmother   . Osteoarthritis Paternal Grandmother   . Osteoarthritis Paternal Grandfather   . Colon cancer Neg Hx     ROS:  Pertinent items are noted in HPI.  Otherwise, a comprehensive ROS was negative.  Exam:   BP 102/70 (BP Location: Right Arm, Patient Position: Sitting, Cuff Size: Normal)   Pulse 72   Resp 16   Ht 5' 6.5" (1.689 m)   Wt 163 lb (73.9 kg)   LMP 07/17/2017   BMI 25.91 kg/m     General appearance: alert, cooperative and appears stated age Head: Normocephalic, without obvious abnormality, atraumatic Neck: no adenopathy, supple, symmetrical, trachea midline and thyroid normal to inspection and palpation Lungs: clear to auscultation bilaterally Breasts: normal appearance, no masses or tenderness, No nipple retraction or dimpling, No nipple discharge or bleeding, No axillary or supraclavicular adenopathy Heart: regular rate and rhythm Abdomen: soft, non-tender; no masses, no organomegaly Extremities: extremities normal, atraumatic, no cyanosis or edema Skin: Skin color, texture, turgor normal. No rashes or  lesions Lymph nodes: Cervical, supraclavicular, and axillary nodes normal. No abnormal inguinal nodes palpated Neurologic: Grossly normal  Pelvic: External genitalia:  no lesions              Urethra:  normal appearing urethra with no masses, tenderness or lesions              Bartholins and Skenes: normal                 Vagina: normal appearing vagina with normal color and discharge, no lesions              Cervix: no lesions              Pap taken: No. Bimanual Exam:  Uterus:  normal size, contour, position, consistency, mobility, non-tender.  Moderate Kegel.                          Adnexa: no mass, fullness, tenderness              Rectal exam: Yes.  .  Confirms.               Anus:  normal sphincter tone, no lesions  Chaperone was present for exam.  Assessment:   Well woman visit with normal exam. Hypertriglyceridemia.  Hx migraine with aura.  Genuine stress incontinence.  Vaginal discharge.  I suspect this is ovulatory mucous.  Plan: Mammogram screening discussed. Recommended self breast awareness. Pap and HR HPV as above. Guidelines for Calcium, Vitamin D, regular exercise program including cardiovascular and weight bearing exercise. Kegel's reviewed.  I also mentioned possible PT and midurethral sling.  She will call if she desires either. Follow up annually and prn.   After visit summary provided.

## 2017-08-18 ENCOUNTER — Encounter: Payer: Self-pay | Admitting: Obstetrics and Gynecology

## 2017-08-26 ENCOUNTER — Telehealth: Payer: Self-pay | Admitting: Family Medicine

## 2017-08-26 DIAGNOSIS — E7800 Pure hypercholesterolemia, unspecified: Secondary | ICD-10-CM

## 2017-08-26 DIAGNOSIS — Z0000 Encounter for general adult medical examination without abnormal findings: Secondary | ICD-10-CM

## 2017-08-26 NOTE — Telephone Encounter (Signed)
-----   Message from Ellamae Sia sent at 08/25/2017 12:39 PM EDT ----- Regarding: Lab orders for Tuesday, 4.30.19 Patient is scheduled for CPX labs, please order future labs, Thanks , Karna Christmas

## 2017-09-01 ENCOUNTER — Other Ambulatory Visit (INDEPENDENT_AMBULATORY_CARE_PROVIDER_SITE_OTHER): Payer: Managed Care, Other (non HMO)

## 2017-09-01 DIAGNOSIS — E7800 Pure hypercholesterolemia, unspecified: Secondary | ICD-10-CM

## 2017-09-01 DIAGNOSIS — Z0000 Encounter for general adult medical examination without abnormal findings: Secondary | ICD-10-CM

## 2017-09-01 LAB — COMPREHENSIVE METABOLIC PANEL
ALT: 10 U/L (ref 0–35)
AST: 14 U/L (ref 0–37)
Albumin: 4.1 g/dL (ref 3.5–5.2)
Alkaline Phosphatase: 39 U/L (ref 39–117)
BUN: 13 mg/dL (ref 6–23)
CALCIUM: 9 mg/dL (ref 8.4–10.5)
CHLORIDE: 105 meq/L (ref 96–112)
CO2: 26 meq/L (ref 19–32)
Creatinine, Ser: 0.79 mg/dL (ref 0.40–1.20)
GFR: 82.05 mL/min (ref >60.00)
Glucose, Bld: 74 mg/dL (ref 70–99)
Potassium: 4.3 mEq/L (ref 3.5–5.1)
SODIUM: 137 meq/L (ref 135–145)
Total Bilirubin: 0.4 mg/dL (ref 0.2–1.2)
Total Protein: 7.5 g/dL (ref 6.0–8.3)

## 2017-09-01 LAB — CBC WITH DIFFERENTIAL/PLATELET
BASOS PCT: 0.6 % (ref 0.0–3.0)
Basophils Absolute: 0 10*3/uL (ref 0.0–0.1)
EOS ABS: 0 10*3/uL (ref 0.0–0.7)
Eosinophils Relative: 0 % (ref 0.0–5.0)
HEMATOCRIT: 39.1 % (ref 36.0–46.0)
Hemoglobin: 13.7 g/dL (ref 12.0–15.0)
LYMPHS PCT: 23.2 % (ref 12.0–46.0)
Lymphs Abs: 0.9 10*3/uL (ref 0.7–4.0)
MCHC: 35.1 g/dL (ref 30.0–36.0)
MCV: 84.7 fl (ref 78.0–100.0)
Monocytes Absolute: 0.4 10*3/uL (ref 0.1–1.0)
Monocytes Relative: 10.6 % (ref 3.0–12.0)
NEUTROS ABS: 2.6 10*3/uL (ref 1.4–7.7)
Neutrophils Relative %: 65.6 % (ref 43.0–77.0)
PLATELETS: 282 10*3/uL (ref 150.0–400.0)
RBC: 4.61 Mil/uL (ref 3.87–5.11)
RDW: 13.5 % (ref 11.5–15.5)
WBC: 3.9 10*3/uL — ABNORMAL LOW (ref 4.0–10.5)

## 2017-09-01 LAB — TSH: TSH: 1.37 u[IU]/mL (ref 0.35–4.50)

## 2017-09-01 LAB — LIPID PANEL
CHOL/HDL RATIO: 5
Cholesterol: 200 mg/dL (ref 0–200)
HDL: 42.7 mg/dL (ref >39.00)
LDL CALC: 131 mg/dL — AB (ref 0–99)
NONHDL: 157.6
Triglycerides: 133 mg/dL (ref 0.0–149.0)
VLDL: 26.6 mg/dL (ref 0.0–40.0)

## 2017-09-04 ENCOUNTER — Encounter: Payer: 59 | Admitting: Family Medicine

## 2017-09-15 ENCOUNTER — Encounter: Payer: Self-pay | Admitting: Family Medicine

## 2017-09-15 ENCOUNTER — Ambulatory Visit (INDEPENDENT_AMBULATORY_CARE_PROVIDER_SITE_OTHER): Payer: Managed Care, Other (non HMO) | Admitting: Family Medicine

## 2017-09-15 VITALS — BP 116/78 | HR 61 | Temp 97.6°F | Ht 66.5 in | Wt 164.5 lb

## 2017-09-15 DIAGNOSIS — E7800 Pure hypercholesterolemia, unspecified: Secondary | ICD-10-CM | POA: Diagnosis not present

## 2017-09-15 DIAGNOSIS — Z0000 Encounter for general adult medical examination without abnormal findings: Secondary | ICD-10-CM

## 2017-09-15 NOTE — Progress Notes (Signed)
Subjective:    Patient ID: Nicole Shelton, female    DOB: 09/03/1965, 52 y.o.   MRN: [id number]  HPI  Here for health maintenance exam and to review chronic medical problems    Doing well overall  Nothing new  Trying to take care of herself    Wt Readings from Last 3 Encounters:  09/15/17 164 lb 8 oz (74.6 kg)  07/29/17 163 lb (73.9 kg)  02/03/17 162 lb (73.5 kg)   Hard with work and family to put herself first  No regular exercise - is working a walk back to routine  Eats a healthy diet  26.15 kg/m   Had gyn visit in 3/19  Did not have a pap this year - gets one every 3 years   Flu shot 10/18  Mammogram 2/19 nl  Self breast exam -no lumps   Colonoscopy 9/15 adenoma with recall 2020   Tetanus shot 6/13  Father - skin cancer (after double lung transplant) -related to pulm fibrosis  Also anti rejection drugs   Hyperlipidemia Lab Results  Component Value Date   CHOL 200 09/01/2017   CHOL 197 08/28/2016   CHOL 209 (H) 06/27/2015   Lab Results  Component Value Date   HDL 42.70 09/01/2017   HDL 41.50 08/28/2016   HDL 47 06/27/2015   Lab Results  Component Value Date   LDLCALC 131 (H) 09/01/2017   LDLCALC 137 (H) 08/28/2016   LDLCALC 144 (H) 06/27/2015   Lab Results  Component Value Date   TRIG 133.0 09/01/2017   TRIG 93.0 08/28/2016   TRIG 89 06/27/2015   Lab Results  Component Value Date   CHOLHDL 5 09/01/2017   CHOLHDL 5 08/28/2016   CHOLHDL 4.4 06/27/2015   Lab Results  Component Value Date   LDLDIRECT 139.5 10/06/2011   LDLDIRECT 149.0 11/27/2008   LDLDIRECT 144.0 08/17/2008    Could exercise more  LDL is slt improved Does not eat a lot of fast food or fatty foods  Eats red meat once in a while   Other labs Results for orders placed or performed in visit on 09/01/17  TSH  Result Value Ref Range   TSH 1.37 0.35 - 4.50 uIU/mL  Lipid panel  Result Value Ref Range   Cholesterol 200 0 - 200 mg/dL   Triglycerides 133.0 0.0 -  149.0 mg/dL   HDL 42.70 >39.00 mg/dL   VLDL 26.6 0.0 - 40.0 mg/dL   LDL Cholesterol 131 (H) 0 - 99 mg/dL   Total CHOL/HDL Ratio 5    NonHDL 157.60   Comprehensive metabolic panel  Result Value Ref Range   Sodium 137 135 - 145 mEq/L   Potassium 4.3 3.5 - 5.1 mEq/L   Chloride 105 96 - 112 mEq/L   CO2 26 19 - 32 mEq/L   Glucose, Bld 74 70 - 99 mg/dL   BUN 13 6 - 23 mg/dL   Creatinine, Ser 0.79 0.40 - 1.20 mg/dL   Total Bilirubin 0.4 0.2 - 1.2 mg/dL   Alkaline Phosphatase 39 39 - 117 U/L   AST 14 0 - 37 U/L   ALT 10 0 - 35 U/L   Total Protein 7.5 6.0 - 8.3 g/dL   Albumin 4.1 3.5 - 5.2 g/dL   Calcium 9.0 8.4 - 10.5 mg/dL   GFR 82.05 >60.00 mL/min  CBC with Differential/Platelet  Result Value Ref Range   WBC 3.9 (L) 4.0 - 10.5 K/uL   RBC 4.61 3.87 - 5.11 Mil/uL  Hemoglobin 13.7 12.0 - 15.0 g/dL   HCT 39.1 36.0 - 46.0 %   MCV 84.7 78.0 - 100.0 fl   MCHC 35.1 30.0 - 36.0 g/dL   RDW 13.5 11.5 - 15.5 %   Platelets 282.0 150.0 - 400.0 K/uL   Neutrophils Relative % 65.6 43.0 - 77.0 %   Lymphocytes Relative 23.2 12.0 - 46.0 %   Monocytes Relative 10.6 3.0 - 12.0 %   Eosinophils Relative 0.0 0.0 - 5.0 %   Basophils Relative 0.6 0.0 - 3.0 %   Neutro Abs 2.6 1.4 - 7.7 K/uL   Lymphs Abs 0.9 0.7 - 4.0 K/uL   Monocytes Absolute 0.4 0.1 - 1.0 K/uL   Eosinophils Absolute 0.0 0.0 - 0.7 K/uL   Basophils Absolute 0.0 0.0 - 0.1 K/uL    Patient Active Problem List   Diagnosis Date Noted  . Epigastric burning sensation 02/03/2017  . Adenomatous colon polyp 03/02/2014  . Abdominal bloating 03/02/2014  . Routine general medical examination at a health care facility 10/06/2011  . Hyperlipidemia 04/11/2008  . IRRITABLE BOWEL SYNDROME 04/11/2008   Past Medical History:  Diagnosis Date  . Gallstones 2014  . GERD (gastroesophageal reflux disease)   . History of chicken pox   . History of recurrent UTIs   . Hyperlipemia   . IBS (irritable bowel syndrome)   . Migraines    with aura.    .  Seasonal allergies   . Urinary incontinence    with laughing,coughing,sneezing   Past Surgical History:  Procedure Laterality Date  . CHOLECYSTECTOMY  2014  . DILATION AND CURETTAGE OF UTERUS    . MANDIBLE SURGERY     Social History   Tobacco Use  . Smoking status: Never Smoker  . Smokeless tobacco: Never Used  Substance Use Topics  . Alcohol use: No    Alcohol/week: 0.0 oz  . Drug use: No   Family History  Problem Relation Age of Onset  . Hyperlipidemia Mother        on medication recently  . Irritable bowel syndrome Mother   . Pulmonary fibrosis Father        double lung transplant  . Heart disease Maternal Grandfather   . Bladder Cancer Maternal Grandmother   . Osteoarthritis Paternal Grandmother   . Osteoarthritis Paternal Grandfather   . Colon cancer Neg Hx    Allergies  Allergen Reactions  . Amoxicillin Other (See Comments)    REACTION: back, leg pain  . Cetirizine Hcl     REACTION: sedation  . Claritin [Loratadine]     REACTION: sedation  . Zyrtec Allergy [Cetirizine Hcl]    Current Outpatient Medications on File Prior to Visit  Medication Sig Dispense Refill  . Ibuprofen (ADVIL PO) Take by mouth as needed.    Marland Kitchen PREVIDENT 5000 SENSITIVE 1.1-5 % PSTE Take 1 application by mouth daily.  3   No current facility-administered medications on file prior to visit.     Review of Systems  Constitutional: Negative for activity change, appetite change, fatigue, fever and unexpected weight change.  HENT: Negative for congestion, ear pain, rhinorrhea, sinus pressure and sore throat.   Eyes: Negative for pain, redness and visual disturbance.  Respiratory: Negative for cough, shortness of breath and wheezing.   Cardiovascular: Negative for chest pain and palpitations.  Gastrointestinal: Negative for abdominal pain, blood in stool, constipation and diarrhea.  Endocrine: Negative for polydipsia and polyuria.  Genitourinary: Negative for dysuria, frequency and urgency.    Musculoskeletal: Negative  for arthralgias, back pain and myalgias.  Skin: Negative for pallor and rash.  Allergic/Immunologic: Negative for environmental allergies.  Neurological: Negative for dizziness, syncope and headaches.  Hematological: Negative for adenopathy. Does not bruise/bleed easily.  Psychiatric/Behavioral: Negative for decreased concentration and dysphoric mood. The patient is not nervous/anxious.        Objective:   Physical Exam  Constitutional: She appears well-developed and well-nourished. No distress.  Well appearing   HENT:  Head: Normocephalic and atraumatic.  Right Ear: External ear normal.  Left Ear: External ear normal.  Nose: Nose normal.  Mouth/Throat: Oropharynx is clear and moist.  Eyes: Pupils are equal, round, and reactive to light. Conjunctivae and EOM are normal. Right eye exhibits no discharge. Left eye exhibits no discharge. No scleral icterus.  Neck: Normal range of motion. Neck supple. No JVD present. Carotid bruit is not present. No thyromegaly present.  Cardiovascular: Normal rate, regular rhythm, normal heart sounds and intact distal pulses. Exam reveals no gallop.  Pulmonary/Chest: Effort normal and breath sounds normal. No respiratory distress. She has no wheezes. She has no rales.  Abdominal: Soft. Bowel sounds are normal. She exhibits no distension and no mass. There is no tenderness.  Musculoskeletal: She exhibits no edema or tenderness.  Lymphadenopathy:    She has no cervical adenopathy.  Neurological: She is alert. She has normal reflexes. She displays normal reflexes. No cranial nerve deficit. She exhibits normal muscle tone. Coordination normal.  Skin: Skin is warm and dry. No rash noted. No erythema. No pallor.  Fair complexion  Lentigines   Psychiatric: She has a normal mood and affect.  Pleasant           Assessment & Plan:   Problem List Items Addressed This Visit      Other   Hyperlipidemia    Disc goals for lipids  and reasons to control them Rev last labs with pt Rev low sat fat diet in detail  LDL 131 Goal under 130  Add exercise to inc HDL      Routine general medical examination at a health care facility - Primary    Reviewed health habits including diet and exercise and skin cancer prevention Reviewed appropriate screening tests for age  Also reviewed health mt list, fam hx and immunization status , as well as social and family history   See HPI Labs rev  Enc to add exercise  Gyn care utd Disc low cholesterol diet

## 2017-09-15 NOTE — Patient Instructions (Addendum)
For cholesterol Avoid red meat/ fried foods/ egg yolks/ fatty breakfast meats/ butter, cheese and high fat dairy/ and shellfish    Take care of yourself  Keep trying to fit in a walk a day   Labs are stable

## 2017-09-16 NOTE — Assessment & Plan Note (Signed)
Disc goals for lipids and reasons to control them Rev last labs with pt Rev low sat fat diet in detail  LDL 131 Goal under 130  Add exercise to inc HDL

## 2017-09-16 NOTE — Assessment & Plan Note (Signed)
Reviewed health habits including diet and exercise and skin cancer prevention Reviewed appropriate screening tests for age  Also reviewed health mt list, fam hx and immunization status , as well as social and family history   See HPI Labs rev  Enc to add exercise  Gyn care utd Disc low cholesterol diet

## 2018-02-09 ENCOUNTER — Emergency Department: Payer: Managed Care, Other (non HMO)

## 2018-02-09 ENCOUNTER — Ambulatory Visit: Payer: Self-pay | Admitting: Family Medicine

## 2018-02-09 ENCOUNTER — Encounter: Payer: Self-pay | Admitting: Emergency Medicine

## 2018-02-09 ENCOUNTER — Emergency Department
Admission: EM | Admit: 2018-02-09 | Discharge: 2018-02-09 | Disposition: A | Discharge status: Home / Self Care | Payer: Managed Care, Other (non HMO) | Attending: Emergency Medicine | Admitting: Emergency Medicine

## 2018-02-09 DIAGNOSIS — K5900 Constipation, unspecified: Secondary | ICD-10-CM | POA: Diagnosis not present

## 2018-02-09 DIAGNOSIS — R1084 Generalized abdominal pain: Secondary | ICD-10-CM | POA: Diagnosis present

## 2018-02-09 LAB — URINALYSIS, COMPLETE (UACMP) WITH MICROSCOPIC
Bacteria, UA: NONE SEEN
Bilirubin Urine: NEGATIVE
Glucose, UA: NEGATIVE mg/dL
Hgb urine dipstick: NEGATIVE
KETONES UR: NEGATIVE mg/dL
Leukocytes, UA: NEGATIVE
Nitrite: NEGATIVE
Protein, ur: NEGATIVE mg/dL
Specific Gravity, Urine: 1.015 (ref 1.005–1.030)
pH: 5 (ref 5.0–8.0)

## 2018-02-09 LAB — COMPREHENSIVE METABOLIC PANEL
ALT: 20 U/L (ref 0–44)
AST: 23 U/L (ref 15–41)
Albumin: 4.2 g/dL (ref 3.5–5.0)
Alkaline Phosphatase: 41 U/L (ref 38–126)
Anion gap: 7 (ref 5–15)
BUN: 15 mg/dL (ref 6–20)
CO2: 26 mmol/L (ref 22–32)
CREATININE: 0.76 mg/dL (ref 0.44–1.00)
Calcium: 9 mg/dL (ref 8.9–10.3)
Chloride: 104 mmol/L (ref 98–111)
GFR calc non Af Amer: >60 mL/min (ref >60)
Glucose, Bld: 88 mg/dL (ref 70–99)
Potassium: 3.9 mmol/L (ref 3.5–5.1)
SODIUM: 137 mmol/L (ref 135–145)
Total Bilirubin: 0.8 mg/dL (ref 0.3–1.2)
Total Protein: 8.1 g/dL (ref 6.5–8.1)

## 2018-02-09 LAB — CBC
HCT: 41.2 % (ref 35.0–47.0)
Hemoglobin: 14.2 g/dL (ref 12.0–16.0)
MCH: 29.6 pg (ref 26.0–34.0)
MCHC: 34.4 g/dL (ref 32.0–36.0)
MCV: 85.9 fL (ref 80.0–100.0)
PLATELETS: 284 10*3/uL (ref 150–440)
RBC: 4.8 MIL/uL (ref 3.80–5.20)
RDW: 13 % (ref 11.5–14.5)
WBC: 5.8 10*3/uL (ref 3.6–11.0)

## 2018-02-09 LAB — PREGNANCY, URINE: Preg Test, Ur: NEGATIVE

## 2018-02-09 LAB — LIPASE, BLOOD: Lipase: 39 U/L (ref 11–51)

## 2018-02-09 MED ORDER — ONDANSETRON 4 MG PO TBDP: 4.0000 mg | ORAL_TABLET | Freq: Three times a day (TID) | ORAL | 0 refills | Status: DC | PRN

## 2018-02-09 MED ORDER — DICYCLOMINE HCL 20 MG PO TABS: 20.0000 mg | ORAL_TABLET | Freq: Three times a day (TID) | ORAL | 0 refills | Status: DC | PRN

## 2018-02-09 MED ORDER — POLYETHYLENE GLYCOL 3350 17 G PO PACK: 17.0000 g | PACK | Freq: Every day | ORAL | 0 refills | Status: DC

## 2018-02-09 MED ORDER — FAMOTIDINE 20 MG PO TABS: 20.0000 mg | ORAL_TABLET | Freq: Two times a day (BID) | ORAL | 1 refills | Status: DC

## 2018-02-09 MED ORDER — ONDANSETRON 4 MG PO TBDP
4.0000 mg | ORAL_TABLET | Freq: Once | ORAL | Status: DC
  Filled 2018-02-09: qty 1

## 2018-02-09 NOTE — ED Notes (Signed)
XR notified pt has negative preg test.

## 2018-02-09 NOTE — Telephone Encounter (Signed)
Pt called in c/o having severe abd pain that is doubling her over with vomiting this morning and dry heaves.   This is happening intermittently but is worse yesterday and this morning.  See triage notes.  I have referred her to the ED.   She is going to [facility].  I have routed a note to Dr. Marliss Coots nurse pool so she would be aware of the referral.   Reason for Disposition . [1] SEVERE pain (e.g., excruciating) AND [2] present > 1 hour  Answer Assessment - Initial Assessment Questions 1. LOCATION: "Where does it hurt?"     Hurts underneath ribcage on both sides all the way down.   I'm very bloated. 2. RADIATION: "Does the pain shoot anywhere else?" (e.g., chest, back)     My upper back hurting too. 3. ONSET: "When did the pain begin?" (e.g., minutes, hours or days ago)      Began yesterday.   I've not eaten anything since yesterday.   I've vomited and been dry heaving.    This morning I vomited.   Stomach is hurting bad. 4. SUDDEN: "Gradual or sudden onset?"     Gradually yesterday.    I had upset stomach and diarrhea on Saturday.   Sunday I was fine then yesterday it all started. 5. PATTERN "Does the pain come and go, or is it constant?"    - If constant: "Is it getting better, staying the same, or worsening?"      (Note: Constant means the pain never goes away completely; most serious pain is constant and it progresses)     - If intermittent: "How long does it last?" "Do you have pain now?"     (Note: Intermittent means the pain goes away completely between bouts)     If I lay down it will go away if I'm still.   If I'm up moving around it starts hurting so bad I'm doubled over. 6. SEVERITY: "How bad is the pain?"  (e.g., Scale 1-10; mild, moderate, or severe)   - MILD (1-3): doesn't interfere with normal activities, abdomen soft and not tender to touch    - MODERATE (4-7): interferes with normal activities or awakens from sleep, tender to touch    - SEVERE  (8-10): excruciating pain, doubled over, unable to do any normal activities      9.   It doubles me over.   It's not constant but it eases off then comes back. 7. RECURRENT SYMPTOM: "Have you ever had this type of abdominal pain before?" If so, ask: "When was the last time?" and "What happened that time?"      Yes.    It's a week after I have my period.   This time it's worse.   This happens a lot, not every month.   It happened last month too but just not this bad. 8. CAUSE: "What do you think is causing the abdominal pain?"     I don't know.   My guess is after diarrhea I have acid in my stomach.    I've tried all the OTC medicines and nothing helps.     I saw GYN in Feb. 2019. 9. RELIEVING/AGGRAVATING FACTORS: "What makes it better or worse?" (e.g., movement, antacids, bowel movement)     Nothing helps it. 10. OTHER SYMPTOMS: "Has there been any vomiting, diarrhea, constipation, or urine problems?"       None of the above except some diarrhea and vomited this morning.  11. PREGNANCY: "Is there any chance you are pregnant?" "When was your last menstrual period?"       Still having periods.   Not pregnant.  Protocols used: ABDOMINAL PAIN [facility]

## 2018-02-09 NOTE — ED Notes (Signed)
Pt declined zofran at this time, states she is not nauseated.  Pt given call light, declined blanket at this time.

## 2018-02-09 NOTE — ED Triage Notes (Signed)
Pt reports after her cycle she starts with diarrhea and bloating that lasts a week and then she is good for a couple of weeks until her cycle comes again.

## 2018-02-09 NOTE — ED Provider Notes (Signed)
[facility] Emergency Department Provider Note       Time seen: ----------------------------------------- 9:25 AM on 02/09/2018 -----------------------------------------   I have reviewed the triage vital signs and the nursing notes.  HISTORY   Chief Complaint Abdominal Pain; Nausea; and Emesis    HPI Nicole Shelton is a 52 y.o. female with a history of gallstones, GERD, hyperlipidemia, IBS, migraines who presents to the ED for abdominal pain and bloating that started yesterday.  Patient reports that she started dry heaving and vomiting, reports this is a common occurrence after her menstrual cycle but it gets worse each time.  She called her GYN doctor who advised her to come to the ER for evaluation.  Currently her symptoms are improved.  Past Medical History:  Diagnosis Date  . Gallstones 2014  . GERD (gastroesophageal reflux disease)   . History of chicken pox   . History of recurrent UTIs   . Hyperlipemia   . IBS (irritable bowel syndrome)   . Migraines    with aura.    . Seasonal allergies   . Urinary incontinence    with laughing,coughing,sneezing    Patient Active Problem List   Diagnosis Date Noted  . Epigastric burning sensation 02/03/2017  . Adenomatous colon polyp 03/02/2014  . Abdominal bloating 03/02/2014  . Routine general medical examination at a health care facility 10/06/2011  . Hyperlipidemia 04/11/2008  . IRRITABLE BOWEL SYNDROME 04/11/2008    Past Surgical History:  Procedure Laterality Date  . CHOLECYSTECTOMY  2014  . DILATION AND CURETTAGE OF UTERUS    . MANDIBLE SURGERY      Allergies Amoxicillin; Cetirizine hcl; Claritin [loratadine]; and Zyrtec allergy [cetirizine hcl]  Social History Social History   Tobacco Use  . Smoking status: Never Smoker  . Smokeless tobacco: Never Used  Substance Use Topics  . Alcohol use: No    Alcohol/week: 0.0 standard drinks  . Drug use: No   Review of  Systems Constitutional: Negative for fever. Cardiovascular: Negative for chest pain. Respiratory: Negative for shortness of breath. Gastrointestinal: Positive for recent abdominal pain, vomiting and diarrhea Genitourinary: Negative for dysuria. Musculoskeletal: Negative for back pain. Skin: Negative for rash. Neurological: Negative for headaches, focal weakness or numbness.  All systems negative/normal/unremarkable except as stated in the HPI  ____________________________________________   PHYSICAL EXAM:  VITAL SIGNS: ED Triage Vitals [02/09/18 0851]  Enc Vitals Group     BP 120/74     Pulse Rate 80     Resp 20     Temp 97.9 F (36.6 C)     Temp Source Oral     SpO2 100 %     Weight 152 lb (68.9 kg)     Height 5\' 6"  (1.676 m)     Head Circumference      Peak Flow      Pain Score 1     Pain Loc      Pain Edu?      Excl. in [place]?    Constitutional: Alert and oriented. Well appearing and in no distress. Eyes: Conjunctivae are normal. Normal extraocular movements. ENT   Head: Normocephalic and atraumatic.   Nose: No congestion/rhinnorhea.   Mouth/Throat: Mucous membranes are moist.   Neck: No stridor. Cardiovascular: Normal rate, regular rhythm. No murmurs, rubs, or gallops. Respiratory: Normal respiratory effort without tachypnea nor retractions. Breath sounds are clear and equal bilaterally. No wheezes/rales/rhonchi. Gastrointestinal: Soft and nontender. Normal bowel sounds Musculoskeletal: Nontender with normal range of motion in extremities.  No lower extremity tenderness nor edema. Neurologic:  Normal speech and language. No gross focal neurologic deficits are appreciated.  Skin:  Skin is warm, dry and intact. No rash noted. Psychiatric: Mood and affect are normal. Speech and behavior are normal.  ____________________________________________  EKG: Interpreted by me.  Sinus rhythm rate 70 bpm, low voltage, normal axis, normal  QT.  ____________________________________________  ED COURSE:  As part of my medical decision making, I reviewed the following data within the [place] History obtained from family if available, nursing notes, old chart and ekg, as well as notes from prior ED visits. Patient presented for abdominal pain with diarrhea and vomiting, we will assess with labs and imaging as indicated at this time.   Procedures ____________________________________________   LABS (pertinent positives/negatives)  Labs Reviewed  URINALYSIS, COMPLETE (UACMP) WITH MICROSCOPIC - Abnormal; Notable for the following components:      Result Value   Color, Urine YELLOW (*)    APPearance HAZY (*)    All other components within normal limits  LIPASE, BLOOD  COMPREHENSIVE METABOLIC PANEL  CBC  PREGNANCY, URINE    RADIOLOGY Images were viewed by me  Abdomen 2 view IMPRESSION: Increased colonic stool burden consistent with constipation in the appropriate clinical setting. No acute intra-abdominal abnormality.  ____________________________________________  DIFFERENTIAL DIAGNOSIS   Gastroenteritis, GERD, peptic ulcer disease, intestinal spasm, dysmenorrhea  FINAL ASSESSMENT AND PLAN  Abdominal pain, constipation   Plan: The patient had presented for stomach upset each month a week after her menstrual cycle. Patient's labs were reassuring. Patient's imaging did reveal colonic stool burden consistent with constipation with no other acute findings.  She will be discharged with antacids to take as well as antiemetics and MiraLAX.   Laurence Aly, MD   Note: This note was generated in part or whole with voice recognition software. Voice recognition is usually quite accurate but there are transcription errors that can and very often do occur. I apologize for any typographical errors that were not detected and corrected.     Earleen Newport, MD 02/09/18 1046

## 2018-02-09 NOTE — ED Notes (Addendum)
Pt c/o abdominal pain Pt states she took generic prilosec this morning which may or may not have helped with the abdominal pain. Pt states the pain was worse this morning. Pt vomited x 1 and has been having dry heaves.  Pt states she has not eating or drank anything since yesterday afternoon.

## 2018-02-09 NOTE — ED Triage Notes (Signed)
Pt reports pain to her entire abdomen and bloating that started yesterday. Pt reports this am she started dry heaving and vomiting. Pt reports this has been a common occurrence after her menstrual cycle for a long time but it gets worse each time. Pt states she called her GYN and was advised to come to the ED.

## 2018-02-09 NOTE — Telephone Encounter (Signed)
Thanks, I will watch for records

## 2018-02-09 NOTE — Telephone Encounter (Signed)
Per chart review tab pt is at [facility] ED. 

## 2018-02-09 NOTE — ED Notes (Signed)
Patient transported to X-ray 

## 2018-03-23 ENCOUNTER — Encounter: Payer: Self-pay | Admitting: Physician Assistant

## 2018-03-23 ENCOUNTER — Ambulatory Visit (INDEPENDENT_AMBULATORY_CARE_PROVIDER_SITE_OTHER): Payer: Managed Care, Other (non HMO) | Admitting: Physician Assistant

## 2018-03-23 VITALS — BP 106/62 | HR 76 | Ht 66.0 in | Wt 153.2 lb

## 2018-03-23 DIAGNOSIS — R1084 Generalized abdominal pain: Secondary | ICD-10-CM

## 2018-03-23 DIAGNOSIS — R194 Change in bowel habit: Secondary | ICD-10-CM

## 2018-03-23 NOTE — Progress Notes (Addendum)
Chief Complaint: Abdominal pain and constipation  HPI:    Nicole Shelton is a 52 year old female, known to Dr. Hilarie Fredrickson for her IBS, with history of adenomatous colon polyps, GERD, gallstones status post cholecystectomy, who was referred to me by Abner Greenspan, MD for a complaint of abdominal pain and constipation.      01/03/2014 colonoscopy and EGD.  Colonoscopy revealed normal mucosa in the terminal ileum, sessile polyp measuring 6 mm in size in the transverse colon, redundant colon otherwise normal.  Pathology showed tubular adenoma and repeat was recommended in 5 years.  EGD showed a 2 cm hiatal hernia and was otherwise normal.    05/11/2014 office visit with Dr. Hilarie Fredrickson to discuss alternating bowel habits, bloating and abdominal pain.  It was discussed that she is been started on Creon 1 capsule with meals after she failed to improve with rifaximin.  She did report dramatic improvement with one Creon tablet before meals.    02/09/2018 she presented to the ED for abdominal pain and bloating which had started 02/08/2018.  Abdominal x-ray showed increased colonic stool burden consistent with constipation.  No acute intra-abdominal abnormality.  CBC, CMP and lipase were normal.    Today, patient presents clinic and explains that for the past 7 to 8 months she has had some trouble with her bowel habits around the time of her menstrual cycle.  She explains that about 5 days after her menstrual cycle starts she will start with diarrhea and then severe abdominal cramping which typically lasts for 2 to 3 days.  It will then subside on its own.  In between these episodes she runs constipated telling me that she has a bowel movement once every 3 to 4 days or even once a week at times.  During this last episode she had such severe pain that she also had some vomiting and presented to the ED as above.  There she was told that she was severely constipated.  She was started on Pepcid and MiraLAX.  Tells me that the MiraLAX  eventually worked after a few days when she even tried a stool softener which did not do much.  She is continued on daily Pepcid.    Denies fever, chills, weight loss, anorexia, continued nausea, heartburn, reflux or blood in her stool.  Past Medical History:  Diagnosis Date  . Gallstones 2014  . GERD (gastroesophageal reflux disease)   . History of chicken pox   . History of recurrent UTIs   . Hyperlipemia   . IBS (irritable bowel syndrome)   . Migraines    with aura.    . Seasonal allergies   . Urinary incontinence    with laughing,coughing,sneezing    Past Surgical History:  Procedure Laterality Date  . CHOLECYSTECTOMY  2014  . DILATION AND CURETTAGE OF UTERUS    . MANDIBLE SURGERY      Current Outpatient Medications  Medication Sig Dispense Refill  . famotidine (PEPCID) 20 MG tablet Take 1 tablet (20 mg total) by mouth 2 (two) times daily. 60 tablet 1  . Ibuprofen (ADVIL PO) Take by mouth as needed.    Marland Kitchen PREVIDENT 5000 SENSITIVE 1.1-5 % PSTE Take 1 application by mouth daily.  3   No current facility-administered medications for this visit.     Allergies as of 03/23/2018 - Review Complete 03/23/2018  Allergen Reaction Noted  . Amoxicillin Other (See Comments) 05/05/2008  . Cetirizine hcl  07/11/2009  . Claritin [loratadine]  07/11/2009  Family History  Problem Relation Age of Onset  . Hyperlipidemia Mother        on medication recently  . Irritable bowel syndrome Mother   . Pulmonary fibrosis Father        double lung transplant  . Heart disease Maternal Grandfather   . Bladder Cancer Maternal Grandmother   . Osteoarthritis Paternal Grandmother   . Osteoarthritis Paternal Grandfather   . Colon cancer Neg Hx   . Esophageal cancer Neg Hx   . Pancreatic cancer Neg Hx   . Stomach cancer Neg Hx   . Liver disease Neg Hx     Social History   Socioeconomic History  . Marital status: Married    Spouse name: Not on file  . Number of children: 1  . Years of  education: Not on file  . Highest education level: Not on file  Occupational History  . Occupation: Optometrist    Comment: BS Degree  Social Needs  . Financial resource strain: Not on file  . Food insecurity:    Worry: Not on file    Inability: Not on file  . Transportation needs:    Medical: Not on file    Non-medical: Not on file  Tobacco Use  . Smoking status: Never Smoker  . Smokeless tobacco: Never Used  Substance and Sexual Activity  . Alcohol use: No    Alcohol/week: 0.0 standard drinks  . Drug use: No  . Sexual activity: Yes    Partners: Male    Birth control/protection: Other-see comments    Comment: husband with vasectomy  Lifestyle  . Physical activity:    Days per week: Not on file    Minutes per session: Not on file  . Stress: Not on file  Relationships  . Social connections:    Talks on phone: Not on file    Gets together: Not on file    Attends religious service: Not on file    Active member of club or organization: Not on file    Attends meetings of clubs or organizations: Not on file    Relationship status: Not on file  . Intimate partner violence:    Fear of current or ex partner: Not on file    Emotionally abused: Not on file    Physically abused: Not on file    Forced sexual activity: Not on file  Other Topics Concern  . Not on file  Social History Narrative   G3P1   Has son   Regular exercise- no    Review of Systems:    Constitutional: No weight loss, fever or chills Skin: No rash  Cardiovascular: No chest pain Respiratory: No SOB Gastrointestinal: See HPI and otherwise negative Genitourinary: No dysuria Neurological: No headache, dizziness or syncope Musculoskeletal: No new muscle or joint pain Hematologic: No bleeding  Psychiatric: No history of depression or anxiety   Physical Exam:  Vital signs: BP 106/62   Pulse 76   Ht 5\' 6"  (1.676 m)   Wt 153 lb 3.2 oz (69.5 kg)   BMI 24.73 kg/m   Constitutional:   Pleasant Caucasian  female appears to be in NAD, Well developed, Well nourished, alert and cooperative Head:  Normocephalic and atraumatic. Eyes:   PEERL, EOMI. No icterus. Conjunctiva pink. Ears:  Normal auditory acuity. Neck:  Supple Throat: Oral cavity and pharynx without inflammation, swelling or lesion.  Respiratory: Respirations even and unlabored. Lungs clear to auscultation bilaterally.   No wheezes, crackles, or rhonchi.  Cardiovascular: Normal  S1, S2. No MRG. Regular rate and rhythm. No peripheral edema, cyanosis or pallor.  Gastrointestinal:  Soft, nondistended, nontender. No rebound or guarding. Normal bowel sounds. No appreciable masses or hepatomegaly. Rectal:  Not performed.  Msk:  Symmetrical without gross deformities. Without edema, no deformity or joint abnormality.  Neurologic:  Alert and  oriented x4;  grossly normal neurologically.  Skin:   Dry and intact without significant lesions or rashes. Psychiatric: Demonstrates good judgement and reason without abnormal affect or behaviors.  RELEVANT LABS AND IMAGING: CBC    Component Value Date/Time   WBC 5.8 02/09/2018 0855   RBC 4.80 02/09/2018 0855   HGB 14.2 02/09/2018 0855   HGB 13.4 06/27/2015 0857   HCT 41.2 02/09/2018 0855   PLT 284 02/09/2018 0855   MCV 85.9 02/09/2018 0855   MCH 29.6 02/09/2018 0855   MCHC 34.4 02/09/2018 0855   RDW 13.0 02/09/2018 0855   LYMPHSABS 0.9 09/01/2017 0756   MONOABS 0.4 09/01/2017 0756   EOSABS 0.0 09/01/2017 0756   BASOSABS 0.0 09/01/2017 0756    CMP     Component Value Date/Time   NA 137 02/09/2018 0855   K 3.9 02/09/2018 0855   CL 104 02/09/2018 0855   CO2 26 02/09/2018 0855   GLUCOSE 88 02/09/2018 0855   BUN 15 02/09/2018 0855   CREATININE 0.76 02/09/2018 0855   CREATININE 0.85 06/27/2015 0938   CALCIUM 9.0 02/09/2018 0855   PROT 8.1 02/09/2018 0855   ALBUMIN 4.2 02/09/2018 0855   AST 23 02/09/2018 0855   ALT 20 02/09/2018 0855   ALKPHOS 41 02/09/2018 0855   BILITOT 0.8  02/09/2018 0855   GFRNONAA >60 02/09/2018 0855   GFRAA >60 02/09/2018 0855    Assessment: 1.  Change in bowel habits: Towards constipation and severe constipation after menstrual cycle likely still related to patient's history of IBS. 2.  Abdominal pain: During episodes of what sounds like severe constipation as illustrated on abdominal x-ray in the ER which occur after her period  Plan: 1.  At this time recommend the patient start MiraLAX once daily the day that her period starts as it appears that she becomes severely constipated over this time with some likely fecal leakage/overflow.  This was demonstrated on recent x-ray in the ER.  Explained to the patient that if she feels as though MiraLAX helps on a daily basis in general she can use this every day. 2.  Prescribed Dicyclomine 20 mg every 6 hours as needed for abdominal cramping.  Instructed the patient to use this only if she develops severe abdominal cramping in the future. 3.  Patient can stop Pepcid for now. 4.  Patient will call our clinic and let us know how she does over the next month or so.  If she continues with symptoms would recommend that she come in for follow-up, otherwise continue regimen as above.  Ellouise Newer, PA-C [facility] 03/23/2018, 10:22 AM  Cc: Tower, Wynelle Fanny, MD   Addendum: Reviewed and agree with assessment and management plan. Pyrtle, Lajuan Lines, MD

## 2018-03-23 NOTE — Patient Instructions (Signed)
Discontinue Pepcid (famotidine).  Please purchase the following medications over the counter and take as directed: Miralax 17 grams (1 capful) dissolved in at least 8 ounces water/juice daily when period starts.  Use your dicyclomine every 6 hours as needed for abdominal cramping if this occurs.  Call our office an let us know how you are doing after you have tried this regimen.  Follow up with Ellouise Newer, PA-C or Dr Hilarie Fredrickson when needed.  If you are age 53 or older, your body mass index should be between 23-30. Your Body mass index is 24.73 kg/m. If this is out of the aforementioned range listed, please consider follow up with your Primary Care Provider.  If you are age 45 or younger, your body mass index should be between 19-25. Your Body mass index is 24.73 kg/m. If this is out of the aformentioned range listed, please consider follow up with your Primary Care Provider.

## 2018-09-01 ENCOUNTER — Ambulatory Visit: Payer: 59 | Admitting: Obstetrics and Gynecology

## 2018-09-16 ENCOUNTER — Telehealth: Payer: Self-pay | Admitting: Family Medicine

## 2018-09-16 DIAGNOSIS — E7800 Pure hypercholesterolemia, unspecified: Secondary | ICD-10-CM

## 2018-09-16 DIAGNOSIS — Z0000 Encounter for general adult medical examination without abnormal findings: Secondary | ICD-10-CM

## 2018-09-16 NOTE — Telephone Encounter (Signed)
-----   Message from Cloyd Stagers, RT sent at 09/16/2018 11:33 AM EDT ----- Regarding: Lab Orders for Friday 5.15.2020 Please place lab orders for Friday 5.15.2020,  physical on Thursday 5.21.2020 Thank you, Dyke Maes RT(R)

## 2018-09-20 ENCOUNTER — Other Ambulatory Visit (INDEPENDENT_AMBULATORY_CARE_PROVIDER_SITE_OTHER): Payer: Managed Care, Other (non HMO)

## 2018-09-20 ENCOUNTER — Other Ambulatory Visit: Payer: Self-pay

## 2018-09-20 ENCOUNTER — Other Ambulatory Visit: Payer: Managed Care, Other (non HMO)

## 2018-09-20 DIAGNOSIS — Z0000 Encounter for general adult medical examination without abnormal findings: Secondary | ICD-10-CM | POA: Diagnosis not present

## 2018-09-20 DIAGNOSIS — E7800 Pure hypercholesterolemia, unspecified: Secondary | ICD-10-CM | POA: Diagnosis not present

## 2018-09-20 LAB — COMPREHENSIVE METABOLIC PANEL
ALT: 9 U/L (ref 0–35)
AST: 14 U/L (ref 0–37)
Albumin: 3.9 g/dL (ref 3.5–5.2)
Alkaline Phosphatase: 41 U/L (ref 39–117)
BUN: 14 mg/dL (ref 6–23)
CO2: 24 mEq/L (ref 19–32)
Calcium: 8.3 mg/dL — ABNORMAL LOW (ref 8.4–10.5)
Chloride: 108 mEq/L (ref 96–112)
Creatinine, Ser: 0.69 mg/dL (ref 0.40–1.20)
GFR: 89.87 mL/min (ref >60.00)
Glucose, Bld: 78 mg/dL (ref 70–99)
Potassium: 4.3 mEq/L (ref 3.5–5.1)
Sodium: 139 mEq/L (ref 135–145)
Total Bilirubin: 0.5 mg/dL (ref 0.2–1.2)
Total Protein: 7.1 g/dL (ref 6.0–8.3)

## 2018-09-20 LAB — TSH: TSH: 1.31 u[IU]/mL (ref 0.35–4.50)

## 2018-09-20 LAB — LIPID PANEL
Cholesterol: 183 mg/dL (ref 0–200)
HDL: 36 mg/dL — ABNORMAL LOW (ref >39.00)
LDL Cholesterol: 122 mg/dL — ABNORMAL HIGH (ref 0–99)
NonHDL: 147.36
Total CHOL/HDL Ratio: 5
Triglycerides: 127 mg/dL (ref 0.0–149.0)
VLDL: 25.4 mg/dL (ref 0.0–40.0)

## 2018-09-20 LAB — CBC WITH DIFFERENTIAL/PLATELET
Basophils Absolute: 0 10*3/uL (ref 0.0–0.1)
Basophils Relative: 0.6 % (ref 0.0–3.0)
Eosinophils Absolute: 0 10*3/uL (ref 0.0–0.7)
Eosinophils Relative: 0.1 % (ref 0.0–5.0)
HCT: 37.3 % (ref 36.0–46.0)
Hemoglobin: 13.4 g/dL (ref 12.0–15.0)
Lymphocytes Relative: 25.5 % (ref 12.0–46.0)
Lymphs Abs: 1 10*3/uL (ref 0.7–4.0)
MCHC: 35.8 g/dL (ref 30.0–36.0)
MCV: 83.9 fl (ref 78.0–100.0)
Monocytes Absolute: 0.4 10*3/uL (ref 0.1–1.0)
Monocytes Relative: 10.2 % (ref 3.0–12.0)
Neutro Abs: 2.5 10*3/uL (ref 1.4–7.7)
Neutrophils Relative %: 63.6 % (ref 43.0–77.0)
Platelets: 278 10*3/uL (ref 150.0–400.0)
RBC: 4.45 Mil/uL (ref 3.87–5.11)
RDW: 13.4 % (ref 11.5–15.5)
WBC: 4 10*3/uL (ref 4.0–10.5)

## 2018-09-23 ENCOUNTER — Encounter: Payer: Self-pay | Admitting: Family Medicine

## 2018-09-23 ENCOUNTER — Other Ambulatory Visit: Payer: Self-pay

## 2018-09-23 ENCOUNTER — Ambulatory Visit (INDEPENDENT_AMBULATORY_CARE_PROVIDER_SITE_OTHER): Payer: Managed Care, Other (non HMO) | Admitting: Family Medicine

## 2018-09-23 VITALS — BP 108/68 | HR 70 | Temp 97.8°F | Ht 67.25 in | Wt 165.6 lb

## 2018-09-23 DIAGNOSIS — E7800 Pure hypercholesterolemia, unspecified: Secondary | ICD-10-CM

## 2018-09-23 DIAGNOSIS — Z0000 Encounter for general adult medical examination without abnormal findings: Secondary | ICD-10-CM

## 2018-09-23 DIAGNOSIS — D126 Benign neoplasm of colon, unspecified: Secondary | ICD-10-CM | POA: Diagnosis not present

## 2018-09-23 NOTE — Patient Instructions (Addendum)
You are due for a colonoscopy in sept  Call the office if you do not hear from them   We will refer you to Dr Lorelei Pont for your arm pain   Try to get 1200-1500 mg of calcium per day with at least 1000 iu of vitamin D - for bone health

## 2018-09-23 NOTE — Progress Notes (Signed)
Subjective:    Patient ID: Nicole Shelton, female    DOB: [birth year], 53 y.o.   MRN: [id number]  HPI Here for health maintenance exam and to review chronic medical problems    Working at home Doing well overall w/o complaints    Wt Readings from Last 3 Encounters:  09/23/18 165 lb 9 oz (75.1 kg)  03/23/18 153 lb 3.2 oz (69.5 kg)  02/09/18 152 lb (68.9 kg)  taking care of herself=it is a little harder working at home  Not getting as many steps in   Needs to make a plan for exercise  Eating fairly well  25.74 kg/m   Walks her dog daily  Gyn care  Dr Horald Pollen Neg pap 2/17 with neg HPV and 5 y recall (? Unsure)  Her annual visit is being re scheduled   Flu shot 10/19  Colonoscopy 9/15   5 y recall  That is due in [place] 2/20  Self breast exam- no lumps or changes   Tetanus shot 6/13  Hyperlipidemia  Lab Results  Component Value Date   CHOL 183 09/20/2018   CHOL 200 09/01/2017   CHOL 197 08/28/2016   Lab Results  Component Value Date   HDL 36.00 (L) 09/20/2018   HDL 42.70 09/01/2017   HDL 41.50 08/28/2016   Lab Results  Component Value Date   LDLCALC 122 (H) 09/20/2018   LDLCALC 131 (H) 09/01/2017   LDLCALC 137 (H) 08/28/2016   Lab Results  Component Value Date   TRIG 127.0 09/20/2018   TRIG 133.0 09/01/2017   TRIG 93.0 08/28/2016   Lab Results  Component Value Date   CHOLHDL 5 09/20/2018   CHOLHDL 5 09/01/2017   CHOLHDL 5 08/28/2016   Lab Results  Component Value Date   LDLDIRECT 139.5 10/06/2011   LDLDIRECT 149.0 11/27/2008   LDLDIRECT 144.0 08/17/2008   Trying hard to be mindful  Avoids fast food and greasy food   Other labs Results for orders placed or performed in visit on 09/20/18  TSH  Result Value Ref Range   TSH 1.31 0.35 - 4.50 uIU/mL  Lipid panel  Result Value Ref Range   Cholesterol 183 0 - 200 mg/dL   Triglycerides 127.0 0.0 - 149.0 mg/dL   HDL 36.00 (L) >39.00 mg/dL   VLDL 25.4 0.0 - 40.0 mg/dL   LDL  Cholesterol 122 (H) 0 - 99 mg/dL   Total CHOL/HDL Ratio 5    NonHDL 147.36   Comprehensive metabolic panel  Result Value Ref Range   Sodium 139 135 - 145 mEq/L   Potassium 4.3 3.5 - 5.1 mEq/L   Chloride 108 96 - 112 mEq/L   CO2 24 19 - 32 mEq/L   Glucose, Bld 78 70 - 99 mg/dL   BUN 14 6 - 23 mg/dL   Creatinine, Ser 0.69 0.40 - 1.20 mg/dL   Total Bilirubin 0.5 0.2 - 1.2 mg/dL   Alkaline Phosphatase 41 39 - 117 U/L   AST 14 0 - 37 U/L   ALT 9 0 - 35 U/L   Total Protein 7.1 6.0 - 8.3 g/dL   Albumin 3.9 3.5 - 5.2 g/dL   Calcium 8.3 (L) 8.4 - 10.5 mg/dL   GFR 89.87 >60.00 mL/min  CBC with Differential/Platelet  Result Value Ref Range   WBC 4.0 4.0 - 10.5 K/uL   RBC 4.45 3.87 - 5.11 Mil/uL   Hemoglobin 13.4 12.0 - 15.0 g/dL   HCT 37.3 36.0 - 46.0 %  MCV 83.9 78.0 - 100.0 fl   MCHC 35.8 30.0 - 36.0 g/dL   RDW 13.4 11.5 - 15.5 %   Platelets 278.0 150.0 - 400.0 K/uL   Neutrophils Relative % 63.6 43.0 - 77.0 %   Lymphocytes Relative 25.5 12.0 - 46.0 %   Monocytes Relative 10.2 3.0 - 12.0 %   Eosinophils Relative 0.1 0.0 - 5.0 %   Basophils Relative 0.6 0.0 - 3.0 %   Neutro Abs 2.5 1.4 - 7.7 K/uL   Lymphs Abs 1.0 0.7 - 4.0 K/uL   Monocytes Absolute 0.4 0.1 - 1.0 K/uL   Eosinophils Absolute 0.0 0.0 - 0.7 K/uL   Basophils Absolute 0.0 0.0 - 0.1 K/uL    Had R tennis elbow for a while  That improved and now forearm throbs all the time   Patient Active Problem List   Diagnosis Date Noted  . Epigastric burning sensation 02/03/2017  . Adenomatous colon polyp 03/02/2014  . Abdominal bloating 03/02/2014  . Routine general medical examination at a health care facility 10/06/2011  . Hyperlipidemia 04/11/2008  . IRRITABLE BOWEL SYNDROME 04/11/2008   Past Medical History:  Diagnosis Date  . Gallstones 2014  . GERD (gastroesophageal reflux disease)   . History of chicken pox   . History of recurrent UTIs   . Hyperlipemia   . IBS (irritable bowel syndrome)   . Migraines    with  aura.    . Seasonal allergies   . Urinary incontinence    with laughing,coughing,sneezing   Past Surgical History:  Procedure Laterality Date  . CHOLECYSTECTOMY  2014  . DILATION AND CURETTAGE OF UTERUS    . MANDIBLE SURGERY     Social History   Tobacco Use  . Smoking status: Never Smoker  . Smokeless tobacco: Never Used  Substance Use Topics  . Alcohol use: No    Alcohol/week: 0.0 standard drinks  . Drug use: No   Family History  Problem Relation Age of Onset  . Hyperlipidemia Mother        on medication recently  . Irritable bowel syndrome Mother   . Pulmonary fibrosis Father        double lung transplant  . Heart disease Maternal Grandfather   . Bladder Cancer Maternal Grandmother   . Osteoarthritis Paternal Grandmother   . Osteoarthritis Paternal Grandfather   . Colon cancer Neg Hx   . Esophageal cancer Neg Hx   . Pancreatic cancer Neg Hx   . Stomach cancer Neg Hx   . Liver disease Neg Hx    Allergies  Allergen Reactions  . Amoxicillin Other (See Comments)    REACTION: back, leg pain  . Cetirizine Hcl     REACTION: sedation  . Claritin [Loratadine]     REACTION: sedation   Current Outpatient Medications on File Prior to Visit  Medication Sig Dispense Refill  . Ibuprofen (ADVIL PO) Take by mouth as needed.    . loratadine (CLARITIN) 10 MG tablet Take 10 mg by mouth daily.    . Polyethylene Glycol 3350 (MIRALAX PO) Take by mouth 3 (three) times a week.    Marland Kitchen PREVIDENT 5000 SENSITIVE 1.1-5 % PSTE Take 1 application by mouth daily.  3   No current facility-administered medications on file prior to visit.     Review of Systems  Constitutional: Negative for activity change, appetite change, fatigue, fever and unexpected weight change.  HENT: Negative for congestion, ear pain, rhinorrhea, sinus pressure and sore throat.   Eyes: Negative  for pain, redness and visual disturbance.  Respiratory: Negative for cough, shortness of breath and wheezing.    Cardiovascular: Negative for chest pain and palpitations.  Gastrointestinal: Negative for abdominal pain, blood in stool, constipation and diarrhea.  Endocrine: Negative for polydipsia and polyuria.  Genitourinary: Negative for dysuria, frequency and urgency.  Musculoskeletal: Negative for arthralgias, back pain and myalgias.       R forearm pain radiating from her elbow  Skin: Negative for pallor and rash.  Allergic/Immunologic: Negative for environmental allergies.  Neurological: Negative for dizziness, syncope and headaches.  Hematological: Negative for adenopathy. Does not bruise/bleed easily.  Psychiatric/Behavioral: Negative for decreased concentration and dysphoric mood. The patient is not nervous/anxious.        Objective:   Physical Exam Constitutional:      General: She is not in acute distress.    Appearance: Normal appearance. She is well-developed and normal weight. She is not ill-appearing or diaphoretic.  HENT:     Head: Normocephalic and atraumatic.     Right Ear: Tympanic membrane, ear canal and external ear normal.     Left Ear: Tympanic membrane, ear canal and external ear normal.     Nose: Nose normal.     Mouth/Throat:     Mouth: Mucous membranes are moist.     Pharynx: Oropharynx is clear.  Eyes:     General: No scleral icterus.       Right eye: No discharge.        Left eye: No discharge.     Conjunctiva/sclera: Conjunctivae normal.     Pupils: Pupils are equal, round, and reactive to light.  Neck:     Musculoskeletal: Normal range of motion and neck supple.     Thyroid: No thyromegaly.     Vascular: No carotid bruit or JVD.  Cardiovascular:     Rate and Rhythm: Normal rate and regular rhythm.     Heart sounds: Normal heart sounds. No gallop.   Pulmonary:     Effort: Pulmonary effort is normal. No respiratory distress.     Breath sounds: Normal breath sounds. No wheezing or rales.  Abdominal:     General: Bowel sounds are normal. There is no  distension.     Palpations: Abdomen is soft. There is no mass.     Tenderness: There is no abdominal tenderness.  Genitourinary:    Comments: Gyn exam done by her gyn provider Musculoskeletal:        General: No tenderness.     Right lower leg: No edema.     Left lower leg: No edema.  Lymphadenopathy:     Cervical: No cervical adenopathy.  Skin:    General: Skin is warm and dry.     Coloration: Skin is not pale.     Findings: No erythema or rash.     Comments: Some lentigines   Neurological:     General: No focal deficit present.     Mental Status: She is alert.     Cranial Nerves: No cranial nerve deficit.     Motor: No abnormal muscle tone.     Coordination: Coordination normal.     Gait: Gait normal.     Deep Tendon Reflexes: Reflexes are normal and symmetric. Reflexes normal.  Psychiatric:        Mood and Affect: Mood normal.     Comments: Cheerful and talkative            Assessment & Plan:   Problem List Items Addressed  This Visit      Digestive   Adenomatous colon polyp    Pt aware she will be due for surveillance colonoscopy in sept  Will call if she does not get a reminder from GI office         Other   Hyperlipidemia    Disc goals for lipids and reasons to control them Rev last labs with pt Rev low sat fat diet in detail HDL is down slightly  LDL is improved Enc her to get more exercise       Routine general medical examination at a health care facility - Primary    Reviewed health habits including diet and exercise and skin cancer prevention Reviewed appropriate screening tests for age  Also reviewed health mt list, fam hx and immunization status , as well as social and family history   See HPI Labs reviewed  Doing well  She will call gyn provider to schedule routine visit  utd mammogram  Plans to get colonoscopy when due in the fall  Disc diet /exercise plan for cholesterol and overall health  Also advised to start ca and vit D for bone  health (in addition to exercise)

## 2018-09-23 NOTE — Assessment & Plan Note (Signed)
Reviewed health habits including diet and exercise and skin cancer prevention Reviewed appropriate screening tests for age  Also reviewed health mt list, fam hx and immunization status , as well as social and family history   See HPI Labs reviewed  Doing well  She will call gyn provider to schedule routine visit  utd mammogram  Plans to get colonoscopy when due in the fall  Disc diet /exercise plan for cholesterol and overall health  Also advised to start ca and vit D for bone health (in addition to exercise)

## 2018-09-23 NOTE — Assessment & Plan Note (Signed)
Disc goals for lipids and reasons to control them Rev last labs with pt Rev low sat fat diet in detail HDL is down slightly  LDL is improved Enc her to get more exercise

## 2018-09-23 NOTE — Assessment & Plan Note (Signed)
Pt aware she will be due for surveillance colonoscopy in sept  Will call if she does not get a reminder from GI office

## 2018-09-29 ENCOUNTER — Other Ambulatory Visit: Payer: Self-pay

## 2018-09-29 ENCOUNTER — Ambulatory Visit (INDEPENDENT_AMBULATORY_CARE_PROVIDER_SITE_OTHER): Payer: Managed Care, Other (non HMO) | Admitting: Family Medicine

## 2018-09-29 ENCOUNTER — Encounter: Payer: Self-pay | Admitting: *Deleted

## 2018-09-29 ENCOUNTER — Encounter: Payer: Self-pay | Admitting: Family Medicine

## 2018-09-29 VITALS — BP 108/70 | HR 67 | Temp 98.2°F | Ht 67.25 in | Wt 164.0 lb

## 2018-09-29 DIAGNOSIS — M7711 Lateral epicondylitis, right elbow: Secondary | ICD-10-CM

## 2018-09-29 MED ORDER — DICLOFENAC SODIUM 1 % TD GEL: 2.0000 g | Freq: Four times a day (QID) | TRANSDERMAL | 5 refills | Status: DC

## 2018-09-29 NOTE — Progress Notes (Signed)
Alastor Kneale T. Afrah Burlison, MD Primary Care and [place] at [facility] [place], [address] Phone: [phone]  FAX: [place] - 53 y.o. female  MRN [id number]  Date of Birth: [birth year]  Visit Date: 09/29/2018  PCP: Abner Greenspan, MD  Referred by: Tower, Wynelle Fanny, MD  Chief Complaint  Patient presents with  . Arm Pain    Right-radiates down to hand   Subjective:   Nicole Shelton presents with lateral elbow pain.  Length of symptoms: 6 mo Hand effected: R  Patient describes a dull ache on the lateral elbow. There is some translation in the proximal forearm and in the distal upper arm. It is painful to lift with the hand facing down and to lift with the thumb in an upright position. Supination is painful. Patient points to the lateral epicondyle as the point of maximal tenderness near ECRB.  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  No prior fractures or operative interventions in the effective hand. Prior PT or HEP: none Rare NSAIDS  Denies numbness or tingling. No significant neck or shoulder pain.  Hand of dominance: R  The PMH, PSH, Social History, Family History, Medications, and allergies have been reviewed in [facility], and have been updated if relevant.  Patient Active Problem List   Diagnosis Date Noted  . Adenomatous colon polyp 03/02/2014  . Abdominal bloating 03/02/2014  . Hyperlipidemia 04/11/2008  . IRRITABLE BOWEL SYNDROME 04/11/2008    Past Medical History:  Diagnosis Date  . Gallstones 2014  . GERD (gastroesophageal reflux disease)   . History of chicken pox   . History of recurrent UTIs   . Hyperlipemia   . IBS (irritable bowel syndrome)   . Migraines     with aura.    . Seasonal allergies   . Urinary incontinence    with laughing,coughing,sneezing    Past Surgical History:  Procedure Laterality Date  . CHOLECYSTECTOMY  2014  . DILATION AND CURETTAGE OF UTERUS    . MANDIBLE SURGERY      Social History   Socioeconomic History  . Marital status: Married    Spouse name: Not on file  . Number of children: 1  . Years of education: Not on file  . Highest education level: Not on file  Occupational History  . Occupation: Optometrist    Comment: BS Degree  Social Needs  . Financial resource strain: Not on file  . Food insecurity:    Worry: Not on file    Inability: Not on file  . Transportation needs:    Medical: Not on file    Non-medical: Not on file  Tobacco Use  . Smoking status: Never Smoker  . Smokeless tobacco: Never Used  Substance and Sexual Activity  . Alcohol use: No    Alcohol/week: 0.0 standard drinks  . Drug use: No  . Sexual activity: Yes    Partners: Male    Birth control/protection: Other-see comments    Comment: husband with vasectomy  Lifestyle  . Physical activity:    Days per week: Not on file    Minutes per session: Not on file  . Stress: Not on file  Relationships  . Social connections:  Talks on phone: Not on file    Gets together: Not on file    Attends religious service: Not on file    Active member of club or organization: Not on file    Attends meetings of clubs or organizations: Not on file    Relationship status: Not on file  . Intimate partner violence:    Fear of current or ex partner: Not on file    Emotionally abused: Not on file    Physically abused: Not on file    Forced sexual activity: Not on file  Other Topics Concern  . Not on file  Social History Narrative   G3P1   Has son   Regular exercise- no    Family History  Problem Relation Age of Onset  . Hyperlipidemia Mother        on medication recently  . Irritable bowel syndrome Mother   . Pulmonary fibrosis  Father        double lung transplant  . Heart disease Maternal Grandfather   . Bladder Cancer Maternal Grandmother   . Osteoarthritis Paternal Grandmother   . Osteoarthritis Paternal Grandfather   . Colon cancer Neg Hx   . Esophageal cancer Neg Hx   . Pancreatic cancer Neg Hx   . Stomach cancer Neg Hx   . Liver disease Neg Hx     Allergies  Allergen Reactions  . Amoxicillin Other (See Comments)    REACTION: back, leg pain  . Cetirizine Hcl     REACTION: sedation  . Claritin [Loratadine]     REACTION: sedation    Medication list reviewed and updated in full in [place].  GEN: No fevers, chills. Nontoxic. Primarily MSK c/o today. MSK: Detailed in the HPI GI: tolerating PO intake without difficulty Neuro: No numbness, parasthesias, or tingling associated. Otherwise the pertinent positives of the ROS are noted above.   Objective:   Blood pressure 108/70, pulse 67, temperature 98.2 F (36.8 C), temperature source Oral, height 5' 7.25" (1.708 m), weight 164 lb (74.4 kg), last menstrual period 09/19/2018.  GEN: Well-developed,well-nourished,in no acute distress; alert,appropriate and cooperative throughout examination HEENT: Normocephalic and atraumatic without obvious abnormalities. Ears, externally no deformities PULM: Breathing comfortably in no respiratory distress EXT: No clubbing, cyanosis, or edema PSYCH: Normally interactive. Cooperative during the interview. Pleasant. Friendly and conversant. Not anxious or depressed appearing. Normal, full affect.  R elbow Ecchymosis or edema: neg ROM: full flexion, extension, pronation, supination Shoulder ROM: Full Flexion: 5/5 Extension: 5/5, PAINFUL Supination: 5/5, PAINFUL Pronation: 5/5 Wrist ext: 5/5 Wrist flexion: 5/5 No gross bony abnormality Varus and Valgus stress: stable ECRB tenderness: YES, TTP Medial epicondyle: NT Lateral epicondyle, resisted wrist extension from wrist full pronation and flexion:  PAINFUL grip: 5/5  sensation intact Tinel's, Elbow: negative  Shoulder: B Inspection: No muscle wasting or winging Ecchymosis/edema: neg  AC joint, scapula, clavicle: NT Cervical spine: NT, full ROM Spurling's: neg Abduction: full, 5/5 Flexion: full, 5/5 IR, full, lift-off: 5/5 ER at neutral: full, 5/5 AC crossover and compression: neg Neer: neg Hawkins: neg Drop Test: neg Empty Can: neg Supraspinatus insertion: NT Bicipital groove: NT Speed's: neg Yergason's: neg Sulcus sign: neg Scapular dyskinesis: none C5-T1 intact Sensation intact Grip 5/5   Subjective:   Lateral epicondylitis, right elbow  Classic. 6 mo duration with minimal basic treatment.  Elbow anatomy was reviewed, and tendinopathy was explained.  Pt. given a formal rehab program from [facility] on elbow rehabiliation.  Start off with isometrics and gentle  stretching and ROM progressing to a series of concentric and eccentric exercises should be done starting with no weight, work up to 1 lb, hammer, etc.  Formal PT would be beneficial if symptoms persist Emphasized stretching an cross-friction massage Emphasized proper palms up lifting biomechanics to unload ECRB  Alleve 2 tabs by mouth two times a day over the counter: Take at least for 2 - 3 weeks. This is equal to a prescripton strength dose  I appreciate the opportunity to evaluate this very friendly patient. If you have any question regarding her care or prognosis, do not hesitate to ask.  Follow-up: Given Covid-19, call me if sx cont > 6 weeks, set up PT for ionto and manual therapies  Meds ordered this encounter  Medications  . diclofenac sodium (VOLTAREN) 1 % GEL    Sig: Apply 2 g topically 4 (four) times daily.    Dispense:  3 Tube    Refill:  5    Signed,  Ita Fritzsche T. Oriana Horiuchi, MD   Patient's Medications  New Prescriptions   DICLOFENAC SODIUM (VOLTAREN) 1 % GEL    Apply 2 g topically 4 (four) times daily.  Previous Medications    IBUPROFEN (ADVIL PO)    Take by mouth as needed.   LORATADINE (CLARITIN) 10 MG TABLET    Take 10 mg by mouth daily.   POLYETHYLENE GLYCOL 3350 (MIRALAX PO)    Take by mouth 3 (three) times a week.   PREVIDENT 5000 SENSITIVE 1.1-5 % PSTE    Take 1 application by mouth daily.  Modified Medications   No medications on file  Discontinued Medications   No medications on file

## 2018-09-29 NOTE — Patient Instructions (Signed)
Alleve 2 tabs by mouth two times a day over the counter: Take at least for 2 - 3 weeks. This is equal to a prescripton strength dose (GENERIC CHEAPER EQUIVALENT IS NAPROXEN SODIUM)    

## 2018-10-04 ENCOUNTER — Ambulatory Visit: Payer: Managed Care, Other (non HMO) | Admitting: Family Medicine

## 2018-11-04 ENCOUNTER — Ambulatory Visit (INDEPENDENT_AMBULATORY_CARE_PROVIDER_SITE_OTHER): Payer: Managed Care, Other (non HMO) | Admitting: Family Medicine

## 2018-11-04 ENCOUNTER — Other Ambulatory Visit: Payer: Managed Care, Other (non HMO)

## 2018-11-04 ENCOUNTER — Encounter: Payer: Self-pay | Admitting: Family Medicine

## 2018-11-04 ENCOUNTER — Telehealth: Payer: Self-pay

## 2018-11-04 VITALS — Temp 99.2°F | Wt 164.2 lb

## 2018-11-04 DIAGNOSIS — R300 Dysuria: Secondary | ICD-10-CM | POA: Diagnosis not present

## 2018-11-04 DIAGNOSIS — N3000 Acute cystitis without hematuria: Secondary | ICD-10-CM

## 2018-11-04 LAB — POC URINALSYSI DIPSTICK (AUTOMATED)
Bilirubin, UA: NEGATIVE
Blood, UA: 100
Glucose, UA: NEGATIVE
Ketones, UA: NEGATIVE
Nitrite, UA: POSITIVE
Protein, UA: NEGATIVE
Spec Grav, UA: 1.01 (ref 1.010–1.025)
Urobilinogen, UA: 0.2 E.U./dL
pH, UA: 6 (ref 5.0–8.0)

## 2018-11-04 MED ORDER — SULFAMETHOXAZOLE-TRIMETHOPRIM 800-160 MG PO TABS: 1.0000 | ORAL_TABLET | Freq: Two times a day (BID) | ORAL | 0 refills | Status: DC

## 2018-11-04 NOTE — Telephone Encounter (Signed)
Pt has frequency of urine with pain when urinates,no back or abd pain; had lower back pain last wk. No covid symptoms,no travel and no known exposure to covid. Pt has virtual today with Dr Glori Bickers 11 AM. Will bring urine to lab cart 1 hr prior to appt.

## 2018-11-04 NOTE — Patient Instructions (Signed)
Drink lots of water  Take the bactrim as directed  We will call with a culture result on Monday   If symptoms suddenly worsen or change -please call   Update if not starting to improve in a week or if worsening

## 2018-11-04 NOTE — Progress Notes (Signed)
Virtual Visit via Video Note  I connected with Nicole Shelton on 11/04/18 at 11:00 AM EDT by a video enabled telemedicine application and verified that I am speaking with the correct person using two identifiers.  Location: Patient: home Provider: office    I discussed the limitations of evaluation and management by telemedicine and the availability of in person appointments. The patient expressed understanding and agreed to proceed.  History of Present Illness: Here for uti  Has felt blah since last week  Low back was sore   Now pain to urinate - since Tuesday  Sense of urgency/frequency  No blood in urine Odor to it -noticeable   No uti in a while   No flank pain  No nausea  No fever   She is trying to drink more water  No cranberry juice    ua  Results for orders placed or performed in visit on 11/04/18  POCT Urinalysis Dipstick (Automated)  Result Value Ref Range   Color, UA Yellow    Clarity, UA Cloudy    Glucose, UA Negative Negative   Bilirubin, UA Negative    Ketones, UA Negative    Spec Grav, UA 1.010 1.010 - 1.025   Blood, UA 100 Ery/uL    pH, UA 6.0 5.0 - 8.0   Protein, UA Negative Negative   Urobilinogen, UA 0.2 0.2 or 1.0 E.U./dL   Nitrite, UA Positive    Leukocytes, UA Large (3+) (A) Negative     Review of Systems  Constitutional: Positive for malaise/fatigue. Negative for chills, fever and weight loss.  Respiratory: Negative for cough and shortness of breath.   Cardiovascular: Negative for chest pain and palpitations.  Gastrointestinal: Negative for abdominal pain, heartburn, nausea and vomiting.  Genitourinary: Positive for dysuria, frequency and urgency. Negative for flank pain and hematuria.  Musculoskeletal: Negative for myalgias.  Skin: Negative for rash.  Neurological: Negative for headaches.     Patient Active Problem List   Diagnosis Date Noted  . Acute cystitis 11/04/2018  . Adenomatous colon polyp 03/02/2014  . Abdominal  bloating 03/02/2014  . Hyperlipidemia 04/11/2008  . IRRITABLE BOWEL SYNDROME 04/11/2008   Past Medical History:  Diagnosis Date  . Gallstones 2014  . GERD (gastroesophageal reflux disease)   . History of chicken pox   . History of recurrent UTIs   . Hyperlipemia   . IBS (irritable bowel syndrome)   . Migraines    with aura.    . Seasonal allergies   . Urinary incontinence    with laughing,coughing,sneezing   Past Surgical History:  Procedure Laterality Date  . CHOLECYSTECTOMY  2014  . DILATION AND CURETTAGE OF UTERUS    . MANDIBLE SURGERY     Social History   Tobacco Use  . Smoking status: Never Smoker  . Smokeless tobacco: Never Used  Substance Use Topics  . Alcohol use: No    Alcohol/week: 0.0 standard drinks  . Drug use: No   Family History  Problem Relation Age of Onset  . Hyperlipidemia Mother        on medication recently  . Irritable bowel syndrome Mother   . Pulmonary fibrosis Father        double lung transplant  . Heart disease Maternal Grandfather   . Bladder Cancer Maternal Grandmother   . Osteoarthritis Paternal Grandmother   . Osteoarthritis Paternal Grandfather   . Colon cancer Neg Hx   . Esophageal cancer Neg Hx   . Pancreatic cancer Neg Hx   .  Stomach cancer Neg Hx   . Liver disease Neg Hx    Allergies  Allergen Reactions  . Amoxicillin Other (See Comments)    REACTION: back, leg pain  . Cetirizine Hcl     REACTION: sedation   Current Outpatient Medications on File Prior to Visit  Medication Sig Dispense Refill  . diclofenac sodium (VOLTAREN) 1 % GEL Apply 2 g topically 4 (four) times daily. 3 Tube 5  . Ibuprofen (ADVIL PO) Take by mouth as needed.    . loratadine (CLARITIN) 10 MG tablet Take 10 mg by mouth daily.    . Polyethylene Glycol 3350 (MIRALAX PO) Take by mouth 3 (three) times a week.    Marland Kitchen PREVIDENT 5000 SENSITIVE 1.1-5 % PSTE Take 1 application by mouth daily.  3   No current facility-administered medications on file prior  to visit.     Observations/Objective: Patient appears well, in no distress Weight is baseline  No facial swelling or asymmetry Normal voice-not hoarse and no slurred speech No obvious tremor or mobility impairment Moving neck and UEs normally Able to hear the call well  No cough or shortness of breath during interview  Talkative and mentally sharp with no cognitive changes No skin changes on face or neck , no rash or pallor Affect is normal    Assessment and Plan: Problem List Items Addressed This Visit      Genitourinary   Acute cystitis - Primary    Uncomplicated uti  tx with bactrim Enc fluids Update if not starting to improve in a week or if worsening  cx pending-will update       Relevant Orders   Urine Culture (Completed)    Other Visit Diagnoses    Dysuria       Relevant Orders   POCT Urinalysis Dipstick (Automated) (Completed)       Follow Up Instructions: Drink lots of water  Take the bactrim as directed  We will call with a culture result on Monday   If symptoms suddenly worsen or change -please call   Update if not starting to improve in a week or if worsening      I discussed the assessment and treatment plan with the patient. The patient was provided an opportunity to ask questions and all were answered. The patient agreed with the plan and demonstrated an understanding of the instructions.   The patient was advised to call back or seek an in-person evaluation if the symptoms worsen or if the condition fails to improve as anticipated.     Loura Pardon, MD

## 2018-11-06 LAB — URINE CULTURE
MICRO NUMBER:: 630597
SPECIMEN QUALITY:: ADEQUATE

## 2018-11-07 NOTE — Assessment & Plan Note (Signed)
Uncomplicated uti  tx with bactrim Enc fluids Update if not starting to improve in a week or if worsening  cx pending-will update

## 2018-11-23 NOTE — Telephone Encounter (Signed)
Pt seen 11/04/18.

## 2018-12-03 ENCOUNTER — Encounter: Payer: Self-pay | Admitting: Internal Medicine

## 2018-12-09 ENCOUNTER — Other Ambulatory Visit: Payer: Self-pay

## 2018-12-10 ENCOUNTER — Encounter: Payer: Self-pay | Admitting: Internal Medicine

## 2018-12-10 NOTE — Progress Notes (Signed)
53 y.o. G26P1011 Married Caucasian female here for annual exam.    Menses are regular and manageable.  First day with clotting.  Declines treatment.   Bladder control is manageable.  Still with some leakage with jumping.   Father passed away 09/2018--pulmonary issues.  Working from home and will continue until January.   PCP: Loura Pardon, MD  Patient's last menstrual period was 12/12/2018 (exact date).     Period Cycle (Days): 30 Period Duration (Days): 3 Period Pattern: Regular Menstrual Flow: Heavy Menstrual Control: Maxi pad Menstrual Control Change Freq (Hours): every 6 hours on heaviest day Dysmenorrhea: (!) Mild(patient does have frequent clotting) Dysmenorrhea Symptoms: Headache, Diarrhea       Sexually active: Yes.    The current method of family planning is vasectomy.    Exercising: No.  The patient does not participate in regular exercise at present. Smoker:  no  Health Maintenance: Pap: 06-27-15 Neg:Neg HR HPV  History of abnormal Pap:  No MMG: 06-25-18 Neg/benign intramammary nodes both breasts and benign scattered calcifications both breasts/density C/BiRads2 Colonoscopy: 01-03-14 Adenomatous polyp:next due 01/2019. BMD:   n/a  Result  n/a TDaP:  10-08-11 Gardasil:   N/A HIV: Neg in pregnancy Hep C:no Screening Labs:   PCP.   reports that she has never smoked. She has never used smokeless tobacco. She reports that she does not drink alcohol or use drugs.  Past Medical History:  Diagnosis Date  . Gallstones 2014  . GERD (gastroesophageal reflux disease)   . History of chicken pox   . History of recurrent UTIs   . Hyperlipemia   . IBS (irritable bowel syndrome)   . Migraines    with aura.    . Seasonal allergies   . Urinary incontinence    with laughing,coughing,sneezing    Past Surgical History:  Procedure Laterality Date  . CHOLECYSTECTOMY  2014  . DILATION AND CURETTAGE OF UTERUS    . MANDIBLE SURGERY      Current Outpatient Medications   Medication Sig Dispense Refill  . diclofenac sodium (VOLTAREN) 1 % GEL Apply 2 g topically 4 (four) times daily. 3 Tube 5  . Ibuprofen (ADVIL PO) Take by mouth as needed.    . loratadine (CLARITIN) 10 MG tablet Take 10 mg by mouth daily.    . Polyethylene Glycol 3350 (MIRALAX PO) Take by mouth 3 (three) times a week.    Marland Kitchen PREVIDENT 5000 SENSITIVE 1.1-5 % PSTE Take 1 application by mouth daily.  3   No current facility-administered medications for this visit.     Family History  Problem Relation Age of Onset  . Hyperlipidemia Mother        on medication recently  . Irritable bowel syndrome Mother   . Pulmonary fibrosis Father        double lung transplant  . Heart disease Maternal Grandfather   . Bladder Cancer Maternal Grandmother   . Osteoarthritis Paternal Grandmother   . Osteoarthritis Paternal Grandfather   . Colon cancer Neg Hx   . Esophageal cancer Neg Hx   . Pancreatic cancer Neg Hx   . Stomach cancer Neg Hx   . Liver disease Neg Hx     Review of Systems  All other systems reviewed and are negative.   Exam:   BP 104/66   Pulse 70   Temp (!) 97.4 F (36.3 C) (Temporal)   Resp 20   Ht 5' 6.25" (1.683 m)   Wt 169 lb (76.7 kg)   LMP  12/12/2018 (Exact Date)   BMI 27.07 kg/m     General appearance: alert, cooperative and appears stated age Head: normocephalic, without obvious abnormality, atraumatic Neck: no adenopathy, supple, symmetrical, trachea midline and thyroid normal to inspection and palpation Lungs: clear to auscultation bilaterally Breasts: normal appearance, no masses or tenderness, No nipple retraction or dimpling, No nipple discharge or bleeding, No axillary adenopathy Heart: regular rate and rhythm Abdomen: soft, non-tender; no masses, no organomegaly Extremities: extremities normal, atraumatic, no cyanosis or edema Skin: skin color, texture, turgor normal. No rashes or lesions Lymph nodes: cervical, supraclavicular, and axillary nodes  normal. Neurologic: grossly normal  Pelvic: External genitalia:  no lesions              No abnormal inguinal nodes palpated.              Urethra:  normal appearing urethra with no masses, tenderness or lesions              Bartholins and Skenes: normal                 Vagina: normal appearing vagina with normal color and discharge, no lesions              Cervix: no lesions              Pap taken: Yes.    Per request.  Bimanual Exam:  Uterus:  normal size, contour, position, consistency, mobility, non-tender              Adnexa: no mass, fullness, tenderness              Rectal exam: Yes.  .  Confirms.              Anus:  normal sphincter tone, no lesions  Chaperone was present for exam.  Assessment:   Well woman visit with normal exam. Hypertriglyceridemia.  Hx migraine with aura.  Genuine stress incontinence.  Plan: Mammogram screening discussed. Self breast awareness reviewed. Pap and HR HPV as above. Guidelines for Calcium, Vitamin D, regular exercise program including cardiovascular and weight bearing exercise.  Follow up annually and prn.   After visit summary provided.

## 2018-12-14 ENCOUNTER — Encounter: Payer: Self-pay | Admitting: Obstetrics and Gynecology

## 2018-12-14 ENCOUNTER — Other Ambulatory Visit (HOSPITAL_COMMUNITY)
Admission: RE | Admit: 2018-12-14 | Discharge: 2018-12-14 | Disposition: A | Discharge status: Home / Self Care | Payer: Managed Care, Other (non HMO) | Source: Ambulatory Visit | Attending: Obstetrics and Gynecology | Admitting: Obstetrics and Gynecology

## 2018-12-14 ENCOUNTER — Other Ambulatory Visit: Payer: Self-pay

## 2018-12-14 ENCOUNTER — Ambulatory Visit (INDEPENDENT_AMBULATORY_CARE_PROVIDER_SITE_OTHER): Payer: Managed Care, Other (non HMO) | Admitting: Obstetrics and Gynecology

## 2018-12-14 VITALS — BP 104/66 | HR 70 | Temp 97.4°F | Resp 20 | Ht 66.25 in | Wt 169.0 lb

## 2018-12-14 DIAGNOSIS — Z01419 Encounter for gynecological examination (general) (routine) without abnormal findings: Secondary | ICD-10-CM

## 2018-12-14 NOTE — Patient Instructions (Signed)

## 2018-12-16 LAB — CYTOLOGY - PAP
Diagnosis: NEGATIVE
HPV: NOT DETECTED

## 2019-01-03 ENCOUNTER — Ambulatory Visit (AMBULATORY_SURGERY_CENTER): Payer: Self-pay | Admitting: *Deleted

## 2019-01-03 ENCOUNTER — Other Ambulatory Visit: Payer: Self-pay

## 2019-01-03 VITALS — Temp 97.1°F | Ht 66.5 in | Wt 164.0 lb

## 2019-01-03 DIAGNOSIS — Z86010 Personal history of colonic polyps: Secondary | ICD-10-CM

## 2019-01-03 MED ORDER — NA SULFATE-K SULFATE-MG SULF 17.5-3.13-1.6 GM/177ML PO SOLN: ORAL | 0 refills | Status: DC

## 2019-01-03 NOTE — Progress Notes (Signed)
Patient is here in person for PV. Patient denies any allergies to eggs or soy. Patient denies any problems with anesthesia/sedation. Patient denies any oxygen use at home. Patient denies taking any diet/weight loss medications or blood thinners. Pt denies constipation.  EMMI education assisgned to patient on colonoscopy, this was explained and instructions given to patient.Pt is aware that care partner will wait in the car during procedure; if they feel like they will be too hot to wait in the car; they may wait in the lobby.  We want them to wear a mask (we do not have any that we can provide them), practice social distancing, and we will check their temperatures when they get here.  I did remind patient that their care partner needs to stay in the parking lot the entire time. Pt will wear mask into building. Suprep coupon given to pt.

## 2019-01-05 ENCOUNTER — Encounter: Payer: Self-pay | Admitting: Internal Medicine

## 2019-01-17 ENCOUNTER — Other Ambulatory Visit: Payer: Self-pay

## 2019-01-17 ENCOUNTER — Ambulatory Visit (AMBULATORY_SURGERY_CENTER): Payer: Managed Care, Other (non HMO) | Admitting: Internal Medicine

## 2019-01-17 ENCOUNTER — Encounter: Payer: Self-pay | Admitting: Internal Medicine

## 2019-01-17 VITALS — BP 117/81 | HR 58 | Temp 97.6°F | Resp 21 | Ht 66.0 in | Wt 169.0 lb

## 2019-01-17 DIAGNOSIS — Z86010 Personal history of colonic polyps: Secondary | ICD-10-CM | POA: Diagnosis not present

## 2019-01-17 DIAGNOSIS — D123 Benign neoplasm of transverse colon: Secondary | ICD-10-CM

## 2019-01-17 MED ORDER — SODIUM CHLORIDE 0.9 % IV SOLN: 500.0000 mL | Freq: Once | INTRAVENOUS | Status: DC

## 2019-01-17 NOTE — Op Note (Signed)
[facility] Patient Name: Nicole Shelton Procedure Date: 01/17/2019 11:39 AM MRN: [id number] Endoscopist: Jerene Bears , MD Age: 53 Referring MD:  Date of Birth: [birth year] Gender: Female Account #: [id number] Procedure:                Colonoscopy Indications:              High risk colon cancer surveillance: Personal                            history of non-advanced adenoma, Last colonoscopy:                            September 2015 Medicines:                Monitored Anesthesia Care Procedure:                Pre-Anesthesia Assessment:                           - Prior to the procedure, a History and Physical                            was performed, and patient medications and                            allergies were reviewed. The patient's tolerance of                            previous anesthesia was also reviewed. The risks                            and benefits of the procedure and the sedation                            options and risks were discussed with the patient.                            All questions were answered, and informed consent                            was obtained. Prior Anticoagulants: The patient has                            taken no previous anticoagulant or antiplatelet                            agents. ASA Grade Assessment: II - A patient with                            mild systemic disease. After reviewing the risks                            and benefits, the patient was deemed in  satisfactory condition to undergo the procedure.                           After obtaining informed consent, the colonoscope                            was passed under direct vision. Throughout the                            procedure, the patient's blood pressure, pulse, and                            oxygen saturations were monitored continuously. The                            Colonoscope was introduced through the anus  and                            advanced to the cecum, identified by appendiceal                            orifice and ileocecal valve. The colonoscopy was                            performed without difficulty. The patient tolerated                            the procedure well. The quality of the bowel                            preparation was good. The ileocecal valve,                            appendiceal orifice, and rectum were photographed. Scope In: 11:42:55 AM Scope Out: 11:59:32 AM Scope Withdrawal Time: 0 hours 13 minutes 24 seconds  Total Procedure Duration: 0 hours 16 minutes 37 seconds  Findings:                 The digital rectal exam was normal.                           Two sessile polyps were found in the transverse                            colon. The polyps were 5 to 6 mm in size. These                            polyps were removed with a cold snare. Resection                            and retrieval were complete.                           The exam was otherwise without abnormality on  direct and retroflexion views. Complications:            No immediate complications. Estimated Blood Loss:     Estimated blood loss was minimal. Impression:               - Two 5 to 6 mm polyps in the transverse colon,                            removed with a cold snare. Resected and retrieved.                           - The examination was otherwise normal on direct                            and retroflexion views. Recommendation:           - Patient has a contact number available for                            emergencies. The signs and symptoms of potential                            delayed complications were discussed with the                            patient. Return to normal activities tomorrow.                            Written discharge instructions were provided to the                            patient.                           - Resume  previous diet.                           - Continue present medications.                           - Await pathology results.                           - Repeat colonoscopy is recommended for                            surveillance. The colonoscopy date will be                            determined after pathology results from today's                            exam become available for review. Jerene Bears, MD 01/17/2019 12:01:42 PM This report has been signed electronically.

## 2019-01-17 NOTE — Progress Notes (Signed)
Called to room to assist during endoscopic procedure.  Patient ID and intended procedure confirmed with present staff. Received instructions for my participation in the procedure from the performing physician.  

## 2019-01-17 NOTE — Progress Notes (Signed)
PT taken to PACU. Monitors in place. VSS. Report given to RN. 

## 2019-01-17 NOTE — Progress Notes (Signed)
Patient stated that she was fine about not talking to the doctor.  She said that she would call if she had any questions.  Her husband thought that he was coming back to recovery .  I told him that due to covid, we were not allowing care-partners to come back to [facility].

## 2019-01-17 NOTE — Progress Notes (Signed)
Temp check by JB/Vital check by CW.   No changes in health history since pre-visit screening.

## 2019-01-17 NOTE — Patient Instructions (Addendum)
Thank you for allowing [place] to care for you today!  Await pathology results by mail, approximately 2 weeks.  Recommendation for next colonoscopy will be made at that time.  Continue with current Miralax regimin as it is working for you.  Resume diet and medications today.  Return to your normal activities tomorrow.       YOU HAD AN ENDOSCOPIC PROCEDURE TODAY AT [facility]:   Refer to the procedure report that was given to you for any specific questions about what was found during the examination.  If the procedure report does not answer your questions, please call your gastroenterologist to clarify.  If you requested that your care partner not be given the details of your procedure findings, then the procedure report has been included in a sealed envelope for you to review at your convenience later.  YOU SHOULD EXPECT: Some feelings of bloating in the abdomen. Passage of more gas than usual.  Walking can help get rid of the air that was put into your GI tract during the procedure and reduce the bloating. If you had a lower endoscopy (such as a colonoscopy or flexible sigmoidoscopy) you may notice spotting of blood in your stool or on the toilet paper. If you underwent a bowel prep for your procedure, you may not have a normal bowel movement for a few days.  Please Note:  You might notice some irritation and congestion in your nose or some drainage.  This is from the oxygen used during your procedure.  There is no need for concern and it should clear up in a day or so.  SYMPTOMS TO REPORT IMMEDIATELY:   Following lower endoscopy (colonoscopy or flexible sigmoidoscopy):  Excessive amounts of blood in the stool  Significant tenderness or worsening of abdominal pains  Swelling of the abdomen that is new, acute  Fever of 100F or higher   For urgent or emergent issues, a gastroenterologist can be reached at any hour by calling [phone].   DIET:  We do recommend a  small meal at first, but then you may proceed to your regular diet.  Drink plenty of fluids but you should avoid alcoholic beverages for 24 hours.  ACTIVITY:  You should plan to take it easy for the rest of today and you should NOT DRIVE or use heavy machinery until tomorrow (because of the sedation medicines used during the test).    FOLLOW UP: Our staff will call the number listed on your records 48-72 hours following your procedure to check on you and address any questions or concerns that you may have regarding the information given to you following your procedure. If we do not reach you, we will leave a message.  We will attempt to reach you two times.  During this call, we will ask if you have developed any symptoms of COVID 19. If you develop any symptoms (ie: fever, flu-like symptoms, shortness of breath, cough etc.) before then, please call [phone].  If you test positive for Covid 19 in the 2 weeks post procedure, please call and report this information to [place].    If any biopsies were taken you will be contacted by phone or by letter within the next 1-3 weeks.  Please call us at [phone] if you have not heard about the biopsies in 3 weeks.    SIGNATURES/CONFIDENTIALITY: You and/or your care partner have signed paperwork which will be entered into your electronic medical record.  These signatures attest to  the fact that that the information above on your After Visit Summary has been reviewed and is understood.  Full responsibility of the confidentiality of this discharge information lies with you and/or your care-partner.

## 2019-01-19 ENCOUNTER — Encounter: Payer: Self-pay | Admitting: Internal Medicine

## 2019-01-19 ENCOUNTER — Telehealth: Payer: Self-pay | Admitting: *Deleted

## 2019-01-19 NOTE — Telephone Encounter (Signed)
  Follow up Call-  Call back number 01/17/2019  Post procedure Call Back phone  # [phone]  Permission to leave phone message Yes  Some recent data might be hidden     Patient questions:  Do you have a fever, pain , or abdominal swelling? No. Pain Score  0 *  Have you tolerated food without any problems? Yes.    Have you been able to return to your normal activities? Yes.    Do you have any questions about your discharge instructions: Diet   No. Medications  No. Follow up visit  No.  Do you have questions or concerns about your Care? No.  Actions: * If pain score is 4 or above: No action needed, pain <4.  1. Have you developed a fever since your procedure? no  2.   Have you had an respiratory symptoms (SOB or cough) since your procedure?no  3.   Have you tested positive for COVID 19 since your procedure no  4.   Have you had any family members/close contacts diagnosed with the COVID 19 since your procedure?  no   If yes to any of these questions please route to Joylene John, RN and Alphonsa Gin, Therapist, sports.

## 2019-05-05 ENCOUNTER — Encounter

## 2019-06-29 ENCOUNTER — Encounter: Payer: Self-pay | Admitting: Obstetrics and Gynecology

## 2019-09-13 ENCOUNTER — Telehealth: Payer: Self-pay | Admitting: Family Medicine

## 2019-09-13 DIAGNOSIS — E7800 Pure hypercholesterolemia, unspecified: Secondary | ICD-10-CM

## 2019-09-13 DIAGNOSIS — Z0000 Encounter for general adult medical examination without abnormal findings: Secondary | ICD-10-CM

## 2019-09-13 NOTE — Telephone Encounter (Signed)
-----   Message from Ellamae Sia sent at 09/07/2019  2:47 PM EDT ----- Regarding: Lab orders for Monday, 5.17.21 Patient is scheduled for CPX labs, please order future labs, Thanks , Karna Christmas

## 2019-09-19 ENCOUNTER — Other Ambulatory Visit (INDEPENDENT_AMBULATORY_CARE_PROVIDER_SITE_OTHER): Payer: Managed Care, Other (non HMO)

## 2019-09-19 ENCOUNTER — Other Ambulatory Visit: Payer: Self-pay

## 2019-09-19 DIAGNOSIS — E7800 Pure hypercholesterolemia, unspecified: Secondary | ICD-10-CM

## 2019-09-19 DIAGNOSIS — Z0000 Encounter for general adult medical examination without abnormal findings: Secondary | ICD-10-CM

## 2019-09-19 LAB — COMPREHENSIVE METABOLIC PANEL
ALT: 11 U/L (ref 0–35)
AST: 17 U/L (ref 0–37)
Albumin: 3.9 g/dL (ref 3.5–5.2)
Alkaline Phosphatase: 42 U/L (ref 39–117)
BUN: 13 mg/dL (ref 6–23)
CO2: 24 mEq/L (ref 19–32)
Calcium: 8.6 mg/dL (ref 8.4–10.5)
Chloride: 107 mEq/L (ref 96–112)
Creatinine, Ser: 0.8 mg/dL (ref 0.40–1.20)
GFR: 75.46 mL/min (ref >60.00)
Glucose, Bld: 82 mg/dL (ref 70–99)
Potassium: 4.4 mEq/L (ref 3.5–5.1)
Sodium: 138 mEq/L (ref 135–145)
Total Bilirubin: 0.4 mg/dL (ref 0.2–1.2)
Total Protein: 7 g/dL (ref 6.0–8.3)

## 2019-09-19 LAB — CBC WITH DIFFERENTIAL/PLATELET
Basophils Absolute: 0 10*3/uL (ref 0.0–0.1)
Basophils Relative: 0.5 % (ref 0.0–3.0)
Eosinophils Absolute: 0 10*3/uL (ref 0.0–0.7)
Eosinophils Relative: 0.2 % (ref 0.0–5.0)
HCT: 38.5 % (ref 36.0–46.0)
Hemoglobin: 13.4 g/dL (ref 12.0–15.0)
Lymphocytes Relative: 26.6 % (ref 12.0–46.0)
Lymphs Abs: 1.1 10*3/uL (ref 0.7–4.0)
MCHC: 34.8 g/dL (ref 30.0–36.0)
MCV: 84.7 fl (ref 78.0–100.0)
Monocytes Absolute: 0.4 10*3/uL (ref 0.1–1.0)
Monocytes Relative: 10.4 % (ref 3.0–12.0)
Neutro Abs: 2.5 10*3/uL (ref 1.4–7.7)
Neutrophils Relative %: 62.3 % (ref 43.0–77.0)
Platelets: 272 10*3/uL (ref 150.0–400.0)
RBC: 4.55 Mil/uL (ref 3.87–5.11)
RDW: 13.2 % (ref 11.5–15.5)
WBC: 4.1 10*3/uL (ref 4.0–10.5)

## 2019-09-19 LAB — LIPID PANEL
Cholesterol: 195 mg/dL (ref 0–200)
HDL: 39.4 mg/dL (ref >39.00)
LDL Cholesterol: 136 mg/dL — ABNORMAL HIGH (ref 0–99)
NonHDL: 155.88
Total CHOL/HDL Ratio: 5
Triglycerides: 99 mg/dL (ref 0.0–149.0)
VLDL: 19.8 mg/dL (ref 0.0–40.0)

## 2019-09-19 LAB — TSH: TSH: 1.57 u[IU]/mL (ref 0.35–4.50)

## 2019-09-26 ENCOUNTER — Other Ambulatory Visit: Payer: Self-pay

## 2019-09-26 ENCOUNTER — Ambulatory Visit (INDEPENDENT_AMBULATORY_CARE_PROVIDER_SITE_OTHER): Payer: Managed Care, Other (non HMO) | Admitting: Family Medicine

## 2019-09-26 ENCOUNTER — Encounter: Payer: Self-pay | Admitting: Family Medicine

## 2019-09-26 VITALS — BP 114/72 | HR 70 | Temp 97.8°F | Ht 66.75 in | Wt 171.5 lb

## 2019-09-26 DIAGNOSIS — E7800 Pure hypercholesterolemia, unspecified: Secondary | ICD-10-CM | POA: Diagnosis not present

## 2019-09-26 DIAGNOSIS — Z0000 Encounter for general adult medical examination without abnormal findings: Secondary | ICD-10-CM | POA: Diagnosis not present

## 2019-09-26 NOTE — Assessment & Plan Note (Signed)
Reviewed health habits including diet and exercise and skin cancer prevention Reviewed appropriate screening tests for age  Also reviewed health mt list, fam hx and immunization status , as well as social and family history   See HPI Labs reviewed  Disc shingrix vaccine-is interested Disc covid vaccine-is unsure about/ questions answered  Has gyn visit in the summer  Disc need for ca and D  Disc use of sun protection  Mammogram and colonoscopy are up to date

## 2019-09-26 NOTE — Assessment & Plan Note (Signed)
Disc goals for lipids and reasons to control them Rev last labs with pt Rev low sat fat diet in detail LDL is up  (urged to cut back on beef and high fat dairy) May need statin in the future if no imp HDL still under 40-disc exercise and omega 3 to help

## 2019-09-26 NOTE — Progress Notes (Signed)
Subjective:    Patient ID: Nicole Shelton, female    DOB: Sep 28, 1965, 54 y.o.   MRN: [id number]  This visit occurred during the SARS-CoV-2 public health emergency.  Safety protocols were in place, including screening questions prior to the visit, additional usage of staff PPE, and extensive cleaning of exam room while observing appropriate contact time as indicated for disinfecting solutions.    HPI Here for health maintenance exam and to review chronic medical problems    Wt Readings from Last 3 Encounters:  09/26/19 171 lb 8 oz (77.8 kg)  01/17/19 169 lb (76.7 kg)  01/03/19 164 lb (74.4 kg)   27.06 kg/m  Walking for exercise   Doing ok /feeling good   Last week she tweaked her neck- sore on the L side    Flu shot - no  covid status -has not had covid or vaccine  (still considering it) - works from home and not exposed to a lot of people  Tdap 6/13 Zoster status -no vaccine but interested   Mammogram 2/21  Self breast exam -no lumps   Pap 8/20 -neg with neg HPV (gyn) She still has regular periods   Bone health -no ca or D  Colonoscopy 9/20 with 7 y recall   Hyperlipidemia  Lab Results  Component Value Date   CHOL 195 09/19/2019   CHOL 183 09/20/2018   CHOL 200 09/01/2017   Lab Results  Component Value Date   HDL 39.40 09/19/2019   HDL 36.00 (L) 09/20/2018   HDL 42.70 09/01/2017   Lab Results  Component Value Date   LDLCALC 136 (H) 09/19/2019   LDLCALC 122 (H) 09/20/2018   LDLCALC 131 (H) 09/01/2017   Lab Results  Component Value Date   TRIG 99.0 09/19/2019   TRIG 127.0 09/20/2018   TRIG 133.0 09/01/2017   Lab Results  Component Value Date   CHOLHDL 5 09/19/2019   CHOLHDL 5 09/20/2018   CHOLHDL 5 09/01/2017   Lab Results  Component Value Date   LDLDIRECT 139.5 10/06/2011   LDLDIRECT 149.0 11/27/2008   LDLDIRECT 144.0 08/17/2008    LDL is up to 136 HDL is up a bit to 39.4  Some beef and butter and cheese  Avoids fried foods  Eats  grilled chicken   Her mother has hyperlipidemia   Other labs Results for orders placed or performed in visit on 09/19/19  TSH  Result Value Ref Range   TSH 1.57 0.35 - 4.50 uIU/mL  Lipid panel  Result Value Ref Range   Cholesterol 195 0 - 200 mg/dL   Triglycerides 99.0 0.0 - 149.0 mg/dL   HDL 39.40 >39.00 mg/dL   VLDL 19.8 0.0 - 40.0 mg/dL   LDL Cholesterol 136 (H) 0 - 99 mg/dL   Total CHOL/HDL Ratio 5    NonHDL 155.88   Comprehensive metabolic panel  Result Value Ref Range   Sodium 138 135 - 145 mEq/L   Potassium 4.4 3.5 - 5.1 mEq/L   Chloride 107 96 - 112 mEq/L   CO2 24 19 - 32 mEq/L   Glucose, Bld 82 70 - 99 mg/dL   BUN 13 6 - 23 mg/dL   Creatinine, Ser 0.80 0.40 - 1.20 mg/dL   Total Bilirubin 0.4 0.2 - 1.2 mg/dL   Alkaline Phosphatase 42 39 - 117 U/L   AST 17 0 - 37 U/L   ALT 11 0 - 35 U/L   Total Protein 7.0 6.0 - 8.3 g/dL   Albumin  3.9 3.5 - 5.2 g/dL   GFR 75.46 >60.00 mL/min   Calcium 8.6 8.4 - 10.5 mg/dL  CBC with Differential/Platelet  Result Value Ref Range   WBC 4.1 4.0 - 10.5 K/uL   RBC 4.55 3.87 - 5.11 Mil/uL   Hemoglobin 13.4 12.0 - 15.0 g/dL   HCT 38.5 36.0 - 46.0 %   MCV 84.7 78.0 - 100.0 fl   MCHC 34.8 30.0 - 36.0 g/dL   RDW 13.2 11.5 - 15.5 %   Platelets 272.0 150.0 - 400.0 K/uL   Neutrophils Relative % 62.3 43.0 - 77.0 %   Lymphocytes Relative 26.6 12.0 - 46.0 %   Monocytes Relative 10.4 3.0 - 12.0 %   Eosinophils Relative 0.2 0.0 - 5.0 %   Basophils Relative 0.5 0.0 - 3.0 %   Neutro Abs 2.5 1.4 - 7.7 K/uL   Lymphs Abs 1.1 0.7 - 4.0 K/uL   Monocytes Absolute 0.4 0.1 - 1.0 K/uL   Eosinophils Absolute 0.0 0.0 - 0.7 K/uL   Basophils Absolute 0.0 0.0 - 0.1 K/uL    Patient Active Problem List   Diagnosis Date Noted  . Adenomatous colon polyp 03/02/2014  . Abdominal bloating 03/02/2014  . Routine general medical examination at a health care facility 10/06/2011  . Hyperlipidemia 04/11/2008  . IRRITABLE BOWEL SYNDROME 04/11/2008   Past  Medical History:  Diagnosis Date  . Gallstones 2014  . GERD (gastroesophageal reflux disease)   . History of chicken pox   . History of recurrent UTIs   . Hyperlipemia   . IBS (irritable bowel syndrome)   . Migraines    with aura.    . Seasonal allergies   . Urinary incontinence    with laughing,coughing,sneezing   Past Surgical History:  Procedure Laterality Date  . CHOLECYSTECTOMY  2014  . COLONOSCOPY  01/03/2014  . DILATION AND CURETTAGE OF UTERUS    . MANDIBLE SURGERY    . POLYPECTOMY     Social History   Tobacco Use  . Smoking status: Never Smoker  . Smokeless tobacco: Never Used  Substance Use Topics  . Alcohol use: No    Alcohol/week: 0.0 standard drinks  . Drug use: No   Family History  Problem Relation Age of Onset  . Hyperlipidemia Mother        on medication recently  . Irritable bowel syndrome Mother   . Colon polyps Mother   . Pulmonary fibrosis Father        double lung transplant  . Heart disease Maternal Grandfather   . Bladder Cancer Maternal Grandmother   . Osteoarthritis Paternal Grandmother   . Osteoarthritis Paternal Grandfather   . Colon cancer Neg Hx   . Esophageal cancer Neg Hx   . Pancreatic cancer Neg Hx   . Stomach cancer Neg Hx   . Liver disease Neg Hx   . Rectal cancer Neg Hx    Allergies  Allergen Reactions  . Amoxicillin Other (See Comments)    REACTION: back, leg pain  . Cetirizine Hcl     REACTION: sedation   Current Outpatient Medications on File Prior to Visit  Medication Sig Dispense Refill  . Ibuprofen (ADVIL PO) Take by mouth as needed.    . loratadine (CLARITIN) 10 MG tablet Take 10 mg by mouth daily.    . Polyethylene Glycol 3350 (MIRALAX PO) Take by mouth 3 (three) times a week.    Marland Kitchen PREVIDENT 5000 SENSITIVE 1.1-5 % PSTE Take 1 application by mouth daily.  3   No current facility-administered medications on file prior to visit.       Review of Systems  Constitutional: Negative for activity change, appetite  change, fatigue, fever and unexpected weight change.  HENT: Negative for congestion, ear pain, rhinorrhea, sinus pressure and sore throat.   Eyes: Negative for pain, redness and visual disturbance.  Respiratory: Negative for cough, shortness of breath and wheezing.   Cardiovascular: Negative for chest pain and palpitations.  Gastrointestinal: Negative for abdominal pain, blood in stool, constipation and diarrhea.  Endocrine: Negative for polydipsia and polyuria.  Genitourinary: Negative for dysuria, frequency and urgency.  Musculoskeletal: Positive for neck pain. Negative for arthralgias, back pain and myalgias.  Skin: Negative for pallor and rash.  Allergic/Immunologic: Negative for environmental allergies.  Neurological: Negative for dizziness, syncope and headaches.  Hematological: Negative for adenopathy. Does not bruise/bleed easily.  Psychiatric/Behavioral: Negative for decreased concentration and dysphoric mood. The patient is not nervous/anxious.        Objective:   Physical Exam Constitutional:      General: She is not in acute distress.    Appearance: Normal appearance. She is well-developed and normal weight. She is not ill-appearing or diaphoretic.  HENT:     Head: Normocephalic and atraumatic.     Right Ear: Tympanic membrane, ear canal and external ear normal.     Left Ear: Tympanic membrane, ear canal and external ear normal.     Nose: Nose normal. No congestion.     Mouth/Throat:     Mouth: Mucous membranes are moist.     Pharynx: Oropharynx is clear. No posterior oropharyngeal erythema.  Eyes:     General: No scleral icterus.    Extraocular Movements: Extraocular movements intact.     Conjunctiva/sclera: Conjunctivae normal.     Pupils: Pupils are equal, round, and reactive to light.  Neck:     Thyroid: No thyromegaly.     Vascular: No carotid bruit or JVD.  Cardiovascular:     Rate and Rhythm: Normal rate and regular rhythm.     Pulses: Normal pulses.      Heart sounds: Normal heart sounds. No gallop.   Pulmonary:     Effort: Pulmonary effort is normal. No respiratory distress.     Breath sounds: Normal breath sounds. No wheezing.     Comments: Good air exch Chest:     Chest wall: No tenderness.  Abdominal:     General: Bowel sounds are normal. There is no distension or abdominal bruit.     Palpations: Abdomen is soft. There is no mass.     Tenderness: There is no abdominal tenderness.     Hernia: No hernia is present.  Genitourinary:    Comments: Breast and pelvic exam are done by gyn provider  Musculoskeletal:        General: No tenderness. Normal range of motion.     Cervical back: Normal range of motion and neck supple. No rigidity. No muscular tenderness.     Right lower leg: No edema.     Left lower leg: No edema.  Lymphadenopathy:     Cervical: No cervical adenopathy.  Skin:    General: Skin is warm and dry.     Coloration: Skin is not pale.     Findings: No erythema or rash.     Comments: Some lentigines   Neurological:     Mental Status: She is alert. Mental status is at baseline.     Cranial Nerves: No cranial nerve deficit.  Motor: No abnormal muscle tone.     Coordination: Coordination normal.     Gait: Gait normal.     Deep Tendon Reflexes: Reflexes are normal and symmetric. Reflexes normal.  Psychiatric:        Mood and Affect: Mood normal.        Cognition and Memory: Cognition and memory normal.           Assessment & Plan:   Problem List Items Addressed This Visit      Other   Hyperlipidemia    Disc goals for lipids and reasons to control them Rev last labs with pt Rev low sat fat diet in detail LDL is up  (urged to cut back on beef and high fat dairy) May need statin in the future if no imp HDL still under 40-disc exercise and omega 3 to help        Routine general medical examination at a health care facility - Primary    Reviewed health habits including diet and exercise and skin cancer  prevention Reviewed appropriate screening tests for age  Also reviewed health mt list, fam hx and immunization status , as well as social and family history   See HPI Labs reviewed  Disc shingrix vaccine-is interested Disc covid vaccine-is unsure about/ questions answered  Has gyn visit in the summer  Disc need for ca and D  Disc use of sun protection  Mammogram and colonoscopy are up to date

## 2019-09-26 NOTE — Patient Instructions (Addendum)
If you are interested in the shingles vaccine series (Shingrix), call your insurance or pharmacy to check on coverage and location it must be given.  If affordable - you can schedule it here or at your pharmacy depending on coverage   Try to get 1200-1500 mg of calcium per day with at least 1000 iu of vitamin D - for bone health   For cholesterol Avoid red meat/ fried foods/ egg yolks/ fatty breakfast meats/ butter, cheese and high fat dairy/ and shellfish   You may need medication in the future

## 2019-12-14 NOTE — Progress Notes (Deleted)
54 y.o. G27P1011 Married {Race/ethnicity:17218} female here for annual exam.    PCP:     No LMP recorded.           Sexually active: {yes no:314532}  The current method of family planning is {contraception:315051}.    Exercising: {yes no:314532}  {types:19826} Smoker:  {YES NO:22349}  Health Maintenance: Pap:  *** History of abnormal Pap:  {YES NO:22349} MMG:  *** Colonoscopy:  *** BMD:   ***  Result  *** TDaP:  *** Gardasil:   {YES NO:22349} HIV: Hep C: Screening Labs:  Hb today: ***, Urine today: ***   reports that she has never smoked. She has never used smokeless tobacco. She reports that she does not drink alcohol and does not use drugs.  Past Medical History:  Diagnosis Date  . Gallstones 2014  . GERD (gastroesophageal reflux disease)   . History of chicken pox   . History of recurrent UTIs   . Hyperlipemia   . IBS (irritable bowel syndrome)   . Migraines    with aura.    . Seasonal allergies   . Urinary incontinence    with laughing,coughing,sneezing    Past Surgical History:  Procedure Laterality Date  . CHOLECYSTECTOMY  2014  . COLONOSCOPY  01/03/2014  . DILATION AND CURETTAGE OF UTERUS    . MANDIBLE SURGERY    . POLYPECTOMY      Current Outpatient Medications  Medication Sig Dispense Refill  . Ibuprofen (ADVIL PO) Take by mouth as needed.    . loratadine (CLARITIN) 10 MG tablet Take 10 mg by mouth daily.    . Polyethylene Glycol 3350 (MIRALAX PO) Take by mouth 3 (three) times a week.    Marland Kitchen PREVIDENT 5000 SENSITIVE 1.1-5 % PSTE Take 1 application by mouth daily.  3   No current facility-administered medications for this visit.    Family History  Problem Relation Age of Onset  . Hyperlipidemia Mother        on medication recently  . Irritable bowel syndrome Mother   . Colon polyps Mother   . Pulmonary fibrosis Father        double lung transplant  . Heart disease Maternal Grandfather   . Bladder Cancer Maternal Grandmother   . Osteoarthritis  Paternal Grandmother   . Osteoarthritis Paternal Grandfather   . Colon cancer Neg Hx   . Esophageal cancer Neg Hx   . Pancreatic cancer Neg Hx   . Stomach cancer Neg Hx   . Liver disease Neg Hx   . Rectal cancer Neg Hx     Review of Systems  Exam:   There were no vitals taken for this visit.    General appearance: alert, cooperative and appears stated age Head: normocephalic, without obvious abnormality, atraumatic Neck: no adenopathy, supple, symmetrical, trachea midline and thyroid normal to inspection and palpation Lungs: clear to auscultation bilaterally Breasts: normal appearance, no masses or tenderness, No nipple retraction or dimpling, No nipple discharge or bleeding, No axillary adenopathy Heart: regular rate and rhythm Abdomen: soft, non-tender; no masses, no organomegaly Extremities: extremities normal, atraumatic, no cyanosis or edema Skin: skin color, texture, turgor normal. No rashes or lesions Lymph nodes: cervical, supraclavicular, and axillary nodes normal. Neurologic: grossly normal  Pelvic: External genitalia:  no lesions              No abnormal inguinal nodes palpated.              Urethra:  normal appearing urethra with no masses,  tenderness or lesions              Bartholins and Skenes: normal                 Vagina: normal appearing vagina with normal color and discharge, no lesions              Cervix: no lesions              Pap taken: {yes no:314532} Bimanual Exam:  Uterus:  normal size, contour, position, consistency, mobility, non-tender              Adnexa: no mass, fullness, tenderness              Rectal exam: {yes no:314532}.  Confirms.              Anus:  normal sphincter tone, no lesions  Chaperone was present for exam.  Assessment:   Well woman visit with normal exam.   Plan: Mammogram screening discussed. Self breast awareness reviewed. Pap and HR HPV as above. Guidelines for Calcium, Vitamin D, regular exercise program including  cardiovascular and weight bearing exercise.   Follow up annually and prn.   Additional counseling given.  {yes [id number]. _______ minutes face to face time of which over 50% was spent in counseling.    After visit summary provided.

## 2019-12-19 ENCOUNTER — Ambulatory Visit: Payer: Managed Care, Other (non HMO) | Admitting: Obstetrics and Gynecology

## 2019-12-28 ENCOUNTER — Telehealth: Payer: Self-pay

## 2019-12-28 NOTE — Telephone Encounter (Signed)
If she did not have the antibody infusion (sounds like she did not) then she can get a vaccine as soon as she feels better/symptoms resolve  Keep [place] posted

## 2019-12-28 NOTE — Telephone Encounter (Signed)
Pt left v/m that notified by [facility] on 12/20/19 had + covid testing. Pt said ears still feel full,H/A, very tired, prod cough with slight clear phlegm with a lot of chest  congestion and at times hard to get deep breath.pt said she can breathe OK but on an off she feels like she has more problems getting a deep breath due to chest congestion. Pt has lost sense of smell and taste comes and goes but feels like tongue is raw and burns some time when eating certain foods. No CP or SOB or other covid symptoms. Post covid and respiratory clinic appts are mid September. Pt is drinking tons of water and resting; pt is self quarantining. After pt left v/m and pt spoke with [facility] and pt is going back to [facility] to have someone re eval her and listen to her chest with testing as needed. Pt appreciative of cb. Pt had first pfizer covid vaccine at [facility] [employer] [address] in [place] on 12/02/19 and pt did not go back on 12/23/19 to get 2nd shot due to sickness; pt wants to know how long she should wait to get the second pfizer covid vaccine. Pt request cb.

## 2019-12-28 NOTE — Telephone Encounter (Signed)
Left VM letting pt know Dr. Tower's comments  

## 2020-04-24 ENCOUNTER — Telehealth: Payer: Self-pay

## 2020-04-24 NOTE — Telephone Encounter (Signed)
I spoke with pt and she is trying to locate an UC now that she will not have a long wait to be seen. Pt said she would be seen at an UC sometime today and pt does not plan on driving since she has dizziness. No available appts at [facility] today.FYI to Dr Glori Bickers.

## 2020-04-24 NOTE — Telephone Encounter (Signed)
[facility] - Client TELEPHONE ADVICE RECORD [facility] Patient Name: Nicole Shelton Gender: Female DOB: [birth year] Age: 54 Y 23 M 13 D Return Phone Number: [phone] (Primary) Address: City/State/Zip: [place] [address] Client [facility] - Client Client Site [place] MD Contact Type Call Who Is Calling Patient / Member / Family / Caregiver Call Type Triage / Clinical Relationship To Patient Self Return Phone Number [phone] (Primary) Chief Complaint Dizziness Reason for Call Symptomatic / Request for Health Information Initial Comment Having dizziness and room is spins when she lays down. Translation No Nurse Assessment Nurse: Mancel Bale, RN, Butch Penny Date/Time Eilene Ghazi Time): 04/24/2020 8:50:20 AM Confirm and document reason for call. If symptomatic, describe symptoms. ---Caller states she is having symptoms of mild dizziness and the feeling of the "room spinning" when she lays on her room. Symptoms started on Friday. She does have a history of vertigo, but symptoms were different. Does the patient have any new or worsening symptoms? ---Yes Will a triage be completed? ---Yes Related visit to physician within the last 2 weeks? ---No Does the PT have any chronic conditions? (i.e. diabetes, asthma, this includes High risk factors for pregnancy, etc.) ---No Is the patient pregnant or possibly pregnant? (Ask all females between the ages of 57-55) ---No Is this a behavioral health or substance abuse call? ---No Guidelines Guideline Title Affirmed Question Affirmed Notes Nurse Date/Time (Eastern Time) Headache [1] New headache AND [2] age > 46 Sheran Fava 04/24/2020 8:53:55 AM Dizziness - Vertigo Taking a medicine that could cause dizziness (e.g., phenytoin [Dilantin], carbamazepine [Tegretol], primidone [Mysoline]) Mancel Bale, RN, Butch Penny 04/24/2020  8:58:53 AM Disp. Time Eilene Ghazi Time) Disposition Final User 04/24/2020 8:58:36 AM See HCP within 4 Hours (or PCP triage) Mancel Bale, RN, Annitta Jersey NOTE: All timestamps contained within this report are represented as [place] Standard Time. CONFIDENTIALTY NOTICE: This fax transmission is intended only for the addressee. It contains information that is legally privileged, confidential or otherwise protected from use or disclosure. If you are not the intended recipient, you are strictly prohibited from reviewing, disclosing, copying using or disseminating any of this information or taking any action in reliance on or regarding this information. If you have received this fax in error, please notify us immediately by telephone so that we can arrange for its return to [place]. Phone: [phone], Toll-Free: [phone], Fax: [phone] Page: 2 of 2 Call Id: [id number] 04/24/2020 9:07:51 AM See HCP within 4 Hours (or PCP triage) Yes Mancel Bale, RN, Butch Penny Disposition Overriden: See PCP within 24 Hours Override Reason: Patient's symptoms need a higher level of care Caller Disagree/Comply Comply Caller Understands Yes PreDisposition Did not know what to do Care Advice Given Per Guideline * IF OFFICE WILL BE OPEN: You need to be seen within the next 3 or 4 hours. Call your doctor (or NP/PA) now or as soon as the office opens. * IF OFFICE WILL BE OPEN: You need to be seen within the next 3 or 4 hours. Call your doctor (or NP/PA) now or as soon as the office opens. ANOTHER ADULT SHOULD DRIVE: * It is better and safer if another adult drives instead of you. CALL BACK IF: * You become worse Comments User: Marijo Conception, RN Date/Time (Eastern Time): 04/24/2020 8:53:33 AM States she does have a history of migraines and headaches and has had intermittent headaches this weekend. User: Marijo Conception, RN Date/Time Eilene Ghazi Time): 04/24/2020 9:01:35 AM Caller  states she does have sinus symptoms and is taking sinus  medication. This has been going for weeks. User: Marijo Conception, RN Date/Time Eilene Ghazi Time): 04/24/2020 9:07:45 AM Spoke with Morey Hummingbird, RN on the [place], [phone], and she stated she did not have any available appts today in the time frame. Gave instructions to the patient and she verbalizes understanding. Referrals GO TO FACILITY UNDECIDED

## 2020-04-24 NOTE — Telephone Encounter (Signed)
Will watch for correspondence, thanks

## 2020-04-25 ENCOUNTER — Ambulatory Visit: Payer: Managed Care, Other (non HMO) | Admitting: Podiatry

## 2020-04-25 ENCOUNTER — Other Ambulatory Visit: Payer: Self-pay

## 2020-04-25 ENCOUNTER — Encounter: Payer: Self-pay | Admitting: Podiatry

## 2020-04-25 DIAGNOSIS — R142 Eructation: Secondary | ICD-10-CM | POA: Insufficient documentation

## 2020-04-25 DIAGNOSIS — Q828 Other specified congenital malformations of skin: Secondary | ICD-10-CM

## 2020-04-25 DIAGNOSIS — R197 Diarrhea, unspecified: Secondary | ICD-10-CM | POA: Insufficient documentation

## 2020-04-25 DIAGNOSIS — B070 Plantar wart: Secondary | ICD-10-CM | POA: Diagnosis not present

## 2020-04-25 DIAGNOSIS — R141 Gas pain: Secondary | ICD-10-CM | POA: Insufficient documentation

## 2020-04-25 DIAGNOSIS — K219 Gastro-esophageal reflux disease without esophagitis: Secondary | ICD-10-CM | POA: Insufficient documentation

## 2020-04-25 MED ORDER — FLUOROURACIL 5 % EX CREA: TOPICAL_CREAM | Freq: Two times a day (BID) | CUTANEOUS | 0 refills | Status: DC

## 2020-04-25 NOTE — Progress Notes (Signed)
Subjective:  Patient ID: Nicole Shelton, female    DOB: 03-04-1966,  MRN: [id number] HPI Chief Complaint  Patient presents with  . Foot Pain    Plantar forefoot bilateral - small, callused lesions x several months, clustered area on right, tried trimming  . New Patient (Initial Visit)    54 y.o. female presents with the above complaint.   ROS: Denies fever chills nausea vomiting muscle aches pains calf pain back pain chest pain shortness of breath.  Past Medical History:  Diagnosis Date  . Gallstones 2014  . GERD (gastroesophageal reflux disease)   . History of chicken pox   . History of recurrent UTIs   . Hyperlipemia   . IBS (irritable bowel syndrome)   . Migraines    with aura.    . Seasonal allergies   . Urinary incontinence    with laughing,coughing,sneezing   Past Surgical History:  Procedure Laterality Date  . CHOLECYSTECTOMY  2014  . COLONOSCOPY  01/03/2014  . DILATION AND CURETTAGE OF UTERUS    . MANDIBLE SURGERY    . POLYPECTOMY      Current Outpatient Medications:  .  albuterol (VENTOLIN HFA) 108 (90 Base) MCG/ACT inhaler, Inhale into the lungs., Disp: , Rfl:  .  fluorouracil (EFUDEX) 5 % cream, Apply topically 2 (two) times daily., Disp: 40 g, Rfl: 0 .  Ibuprofen (ADVIL PO), Take by mouth as needed., Disp: , Rfl:  .  Polyethylene Glycol 3350 (MIRALAX PO), Take by mouth 3 (three) times a week., Disp: , Rfl:   Allergies  Allergen Reactions  . Amoxicillin Other (See Comments)    REACTION: back, leg pain  . Cetirizine Hcl     REACTION: sedation   Review of Systems Objective:  There were no vitals filed for this visit.  General: Well developed, nourished, in no acute distress, alert and oriented x3   Dermatological: Skin is warm, dry and supple bilateral. Nails x 10 are well maintained; remaining integument appears unremarkable at this time. There are no open sores, no preulcerative lesions, no rash or signs of infection present.  Multiple benign  soft tissue lesions plantar aspect of the bilateral foot verrucoid lesion plantar aspect right foot overlying the fourth and fifth metatarsal heads.  This is superficial skin lines do circumvent the wound and thrombosed capillaries are visible.  Contralateral foot does demonstrate benign keratotic lesions consistent with poor keratomas.  Vascular: Dorsalis Pedis artery and Posterior Tibial artery pedal pulses are 2/4 bilateral with immedate capillary fill time. Pedal hair growth present. No varicosities and no lower extremity edema present bilateral.   Neruologic: Grossly intact via light touch bilateral. Vibratory intact via tuning fork bilateral. Protective threshold with Semmes Wienstein monofilament intact to all pedal sites bilateral. Patellar and Achilles deep tendon reflexes 2+ bilateral. No Babinski or clonus noted bilateral.   Musculoskeletal: No gross boney pedal deformities bilateral. No pain, crepitus, or limitation noted with foot and ankle range of motion bilateral. Muscular strength 5/5 in all groups tested bilateral.  Gait: Unassisted, Nonantalgic.    Radiographs:  None taken  Assessment & Plan:   Assessment: Porokeratosis plantar aspect left foot.  Verruca vulgaris plantar aspect right foot.  Plan: Mechanical debridement of poor keratomas bilateral.  Chemical destruction verrucoid lesion right foot.  Application of Cantharone under occlusion to be washed off thoroughly tomorrow.  Also wrote a prescription for the Efudex cream to be applied twice daily.  I will follow-up with her in 6 to 8 weeks.  Garrel Ridgel, DPM

## 2020-06-14 ENCOUNTER — Other Ambulatory Visit: Payer: Self-pay

## 2020-06-14 ENCOUNTER — Ambulatory Visit (INDEPENDENT_AMBULATORY_CARE_PROVIDER_SITE_OTHER): Payer: Managed Care, Other (non HMO) | Admitting: Obstetrics and Gynecology

## 2020-06-14 ENCOUNTER — Encounter: Payer: Self-pay | Admitting: Obstetrics and Gynecology

## 2020-06-14 VITALS — BP 114/70 | HR 72 | Ht 66.0 in | Wt 172.0 lb

## 2020-06-14 DIAGNOSIS — Z01419 Encounter for gynecological examination (general) (routine) without abnormal findings: Secondary | ICD-10-CM | POA: Diagnosis not present

## 2020-06-14 NOTE — Patient Instructions (Signed)

## 2020-06-14 NOTE — Progress Notes (Signed)
55 y.o. G39P1011 Married Caucasian female here for annual exam.    Monthly menses. 2 days of heavy flow and passes clots.  Has cramping and headaches during her cycle.  Takes Advil and sinus medication.  Not unbearable.   Wearing a panty liner due to clear discharge.  No itching, burning or odor.   Good bladder control.   Working from home.   Received her Covid vaccine, last in September, 2021.   PCP:  Loura Pardon, MD   Patient's last menstrual period was 06/09/2020 (exact date).     Period Cycle (Days): 30 Period Duration (Days): 2 Period Pattern: Regular Menstrual Flow: Heavy Menstrual Control: Maxi pad Menstrual Control Change Freq (Hours): changes pad every 6 hours on heaviest day Dysmenorrhea: (!) Severe (severe first day with clotting) Dysmenorrhea Symptoms: Cramping,Headache     Sexually active: Yes.    The current method of family planning is vasectomy.    Exercising: No.  The patient does not participate in regular exercise at present. Smoker:  no  Health Maintenance: Pap: 12-14-18 Neg:Neg HR HPV, 06-27-15 Neg:Neg HR HPV  History of abnormal Pap:  no MMG: 06-29-19 Neg/BiRads1.  Has appointment in March, 2022.  Colonoscopy: 01-17-19 tubular adenomatous polyps;next 7 years BMD:   n/a  Result  n/a TDaP:  01-24-20 Gardasil:   no HIV:Neg in preg Hep C:no Screening Labs:   PCP.    reports that she has never smoked. She has never used smokeless tobacco. She reports that she does not drink alcohol and does not use drugs.  Past Medical History:  Diagnosis Date  . Gallstones 2014  . GERD (gastroesophageal reflux disease)   . History of chicken pox   . History of recurrent UTIs   . Hyperlipemia   . IBS (irritable bowel syndrome)   . Migraines    with aura.    . Seasonal allergies   . Urinary incontinence    with laughing,coughing,sneezing    Past Surgical History:  Procedure Laterality Date  . CHOLECYSTECTOMY  2014  . COLONOSCOPY  01/03/2014  . DILATION AND  CURETTAGE OF UTERUS    . MANDIBLE SURGERY    . POLYPECTOMY      Current Outpatient Medications  Medication Sig Dispense Refill  . fluorouracil (EFUDEX) 5 % cream Apply topically 2 (two) times daily. 40 g 0  . Ibuprofen (ADVIL PO) Take by mouth as needed.    . Polyethylene Glycol 3350 (MIRALAX PO) Take by mouth 3 (three) times a week.     No current facility-administered medications for this visit.   Calcium 1200 mg slow release, once daily.   Family History  Problem Relation Age of Onset  . Hyperlipidemia Mother        on medication recently  . Irritable bowel syndrome Mother   . Colon polyps Mother   . Pulmonary fibrosis Father        double lung transplant  . Heart disease Maternal Grandfather   . Bladder Cancer Maternal Grandmother   . Osteoarthritis Paternal Grandmother   . Osteoarthritis Paternal Grandfather   . Colon cancer Neg Hx   . Esophageal cancer Neg Hx   . Pancreatic cancer Neg Hx   . Stomach cancer Neg Hx   . Liver disease Neg Hx   . Rectal cancer Neg Hx     Review of Systems  All other systems reviewed and are negative.   Exam:   BP 114/70   Pulse 72   Ht 5\' 6"  (1.676 m)  Wt 172 lb (78 kg)   LMP 06/09/2020 (Exact Date)   SpO2 100%   BMI 27.76 kg/m     General appearance: alert, cooperative and appears stated age Head: normocephalic, without obvious abnormality, atraumatic Neck: no adenopathy, supple, symmetrical, trachea midline and thyroid normal to inspection and palpation Lungs: clear to auscultation bilaterally Breasts: normal appearance, no masses or tenderness, No nipple retraction or dimpling, No nipple discharge or bleeding, No axillary adenopathy Heart: regular rate and rhythm Abdomen: soft, non-tender; no masses, no organomegaly Extremities: extremities normal, atraumatic, no cyanosis or edema Skin: skin color, texture, turgor normal. No rashes or lesions Lymph nodes: cervical, supraclavicular, and axillary nodes normal. Neurologic:  grossly normal  Pelvic: External genitalia:  no lesions              No abnormal inguinal nodes palpated.              Urethra:  normal appearing urethra with no masses, tenderness or lesions              Bartholins and Skenes: normal                 Vagina: normal appearing vagina with normal color and yellow/light brown blood, no lesions              Cervix: no lesions              Pap taken: No. Bimanual Exam:  Uterus:  normal size, contour, position, consistency, mobility, non-tender              Adnexa: no mass, fullness, tenderness              Rectal exam: Yes.  .  Confirms.              Anus:  normal sphincter tone, no lesions  Chaperone was present for exam.  Assessment:   Well woman visit with normal exam. Dysmenorrhea.  Menstrual headache.  Hx migraine with aura.  Hypertriglyceridemia. Genuine stress incontinence.  Plan: Mammogram screening discussed. Self breast awareness reviewed. Pap and HR HPV as above. Guidelines for Calcium, Vitamin D, regular exercise program including cardiovascular and weight bearing exercise. Ibuprofen 800 mg every 8 hours prn.  Declines prescription.  She will reach out if the ibuprofen does not control her symptoms.  We discussed Covid and flu vaccine at her local pharmacy.  Follow up annually and prn.

## 2020-06-22 ENCOUNTER — Other Ambulatory Visit: Payer: Self-pay | Admitting: Otolaryngology

## 2020-06-22 DIAGNOSIS — E041 Nontoxic single thyroid nodule: Secondary | ICD-10-CM

## 2020-06-27 ENCOUNTER — Ambulatory Visit: Payer: Managed Care, Other (non HMO) | Admitting: Podiatry

## 2020-06-28 ENCOUNTER — Other Ambulatory Visit: Payer: Self-pay

## 2020-06-28 ENCOUNTER — Ambulatory Visit
Admission: RE | Admit: 2020-06-28 | Discharge: 2020-06-28 | Disposition: A | Discharge status: Home / Self Care | Payer: Managed Care, Other (non HMO) | Source: Ambulatory Visit | Attending: Otolaryngology | Admitting: Otolaryngology

## 2020-06-28 DIAGNOSIS — E041 Nontoxic single thyroid nodule: Secondary | ICD-10-CM | POA: Diagnosis not present

## 2020-06-29 ENCOUNTER — Other Ambulatory Visit: Payer: Self-pay | Admitting: Otolaryngology

## 2020-06-29 DIAGNOSIS — E041 Nontoxic single thyroid nodule: Secondary | ICD-10-CM

## 2020-07-02 ENCOUNTER — Other Ambulatory Visit: Payer: Self-pay

## 2020-07-02 ENCOUNTER — Encounter: Payer: Self-pay | Admitting: Podiatry

## 2020-07-02 ENCOUNTER — Ambulatory Visit: Payer: Managed Care, Other (non HMO) | Admitting: Podiatry

## 2020-07-02 DIAGNOSIS — B070 Plantar wart: Secondary | ICD-10-CM

## 2020-07-02 DIAGNOSIS — Q828 Other specified congenital malformations of skin: Secondary | ICD-10-CM

## 2020-07-02 NOTE — Progress Notes (Signed)
She presents today for follow-up of her warts and her poor keratomas she states that I am just about figured out how to take care of the poor keratomas I wait till I take a bath and then pick those out.  She states that I was using the wart medicine it seems like the wart is almost gone.  So stopped using the medicine.  Objective: Vital signs stable alert oriented x3.  Pulses are palpable.  Poor keratomas to the plantar aspect of left foot is still prominent debrided those today and enucleated them for her.  I also evaluated the right foot which does demonstrate a mosaic verrucoid lesion to the plantar lateral aspect of the forefoot right.  This does demonstrate thrombosed capillaries which are much more visible at this point than they were previously.  I feel that most likely this is related to the thinning of the tissue and bringing of the verrucoid lesions more close to the surface for exfoliation.  They are no longer tender and painful.  Assessment: Verruca plantaris slowly resolving.  Porokeratosis plantar aspect left foot.  Plan: Debrided the porokeratosis today encouraged her to continue the use of the Efudex cream to the plantar lateral aspect of the right foot under occlusion follow-up with me with questions or concerns or in 6 weeks.

## 2020-07-03 ENCOUNTER — Ambulatory Visit
Admission: RE | Admit: 2020-07-03 | Discharge: 2020-07-03 | Disposition: A | Discharge status: Home / Self Care | Payer: Managed Care, Other (non HMO) | Source: Ambulatory Visit | Attending: Otolaryngology | Admitting: Otolaryngology

## 2020-07-03 DIAGNOSIS — D440 Neoplasm of uncertain behavior of thyroid gland: Secondary | ICD-10-CM | POA: Diagnosis not present

## 2020-07-03 DIAGNOSIS — E041 Nontoxic single thyroid nodule: Secondary | ICD-10-CM | POA: Diagnosis present

## 2020-07-03 NOTE — Discharge Instructions (Signed)
Post Operative Instructions for Thyroid Biopsy  1. Keep pressure bandage over site of biopsy for 3-4 hours after leaving office.  2. Normal activity is permitted as long as it does not involve anything strenuous (i.e., jogging, heavy lifting, etc.).  3. Some minor pain may be experienced when the local anesthesia wears off, and should be relieved with Tylenol, Advil, or ibuprofen.  4. You may experience some bruising and/or swelling at the site of the biopsy.  5. If you should develop severe pain, difficulty swallowing or difficulty breathing, please call the office or [facility] at [phone].  6. Apply ice pack for 20 minutes this evening.      

## 2020-07-23 ENCOUNTER — Encounter: Payer: Self-pay | Admitting: Otolaryngology

## 2020-07-23 LAB — CYTOLOGY - NON PAP

## 2020-09-18 ENCOUNTER — Telehealth: Payer: Self-pay | Admitting: Family Medicine

## 2020-09-18 DIAGNOSIS — E7800 Pure hypercholesterolemia, unspecified: Secondary | ICD-10-CM

## 2020-09-18 DIAGNOSIS — Z0000 Encounter for general adult medical examination without abnormal findings: Secondary | ICD-10-CM

## 2020-09-18 NOTE — Telephone Encounter (Signed)
-----   Message from Ellamae Sia sent at 09/03/2020 11:54 AM EDT ----- Regarding: Lab orders for Wednesday, 5.18.22 Patient is scheduled for CPX labs, please order future labs, Thanks , Karna Christmas

## 2020-09-19 ENCOUNTER — Other Ambulatory Visit (INDEPENDENT_AMBULATORY_CARE_PROVIDER_SITE_OTHER): Payer: Managed Care, Other (non HMO)

## 2020-09-19 ENCOUNTER — Other Ambulatory Visit: Payer: Self-pay

## 2020-09-19 DIAGNOSIS — Z0000 Encounter for general adult medical examination without abnormal findings: Secondary | ICD-10-CM

## 2020-09-19 DIAGNOSIS — E7800 Pure hypercholesterolemia, unspecified: Secondary | ICD-10-CM | POA: Diagnosis not present

## 2020-09-19 LAB — COMPREHENSIVE METABOLIC PANEL
ALT: 12 U/L (ref 0–35)
AST: 16 U/L (ref 0–37)
Albumin: 4.2 g/dL (ref 3.5–5.2)
Alkaline Phosphatase: 42 U/L (ref 39–117)
BUN: 15 mg/dL (ref 6–23)
CO2: 24 mEq/L (ref 19–32)
Calcium: 8.8 mg/dL (ref 8.4–10.5)
Chloride: 106 mEq/L (ref 96–112)
Creatinine, Ser: 0.89 mg/dL (ref 0.40–1.20)
GFR: 74.49 mL/min (ref >60.00)
Glucose, Bld: 84 mg/dL (ref 70–99)
Potassium: 4.6 mEq/L (ref 3.5–5.1)
Sodium: 137 mEq/L (ref 135–145)
Total Bilirubin: 0.5 mg/dL (ref 0.2–1.2)
Total Protein: 7.1 g/dL (ref 6.0–8.3)

## 2020-09-19 LAB — CBC WITH DIFFERENTIAL/PLATELET
Basophils Absolute: 0 10*3/uL (ref 0.0–0.1)
Basophils Relative: 0.4 % (ref 0.0–3.0)
Eosinophils Absolute: 0 10*3/uL (ref 0.0–0.7)
Eosinophils Relative: 0.2 % (ref 0.0–5.0)
HCT: 38.6 % (ref 36.0–46.0)
Hemoglobin: 13.7 g/dL (ref 12.0–15.0)
Lymphocytes Relative: 27.9 % (ref 12.0–46.0)
Lymphs Abs: 1.1 10*3/uL (ref 0.7–4.0)
MCHC: 35.3 g/dL (ref 30.0–36.0)
MCV: 83.9 fl (ref 78.0–100.0)
Monocytes Absolute: 0.4 10*3/uL (ref 0.1–1.0)
Monocytes Relative: 11 % (ref 3.0–12.0)
Neutro Abs: 2.3 10*3/uL (ref 1.4–7.7)
Neutrophils Relative %: 60.5 % (ref 43.0–77.0)
Platelets: 297 10*3/uL (ref 150.0–400.0)
RBC: 4.6 Mil/uL (ref 3.87–5.11)
RDW: 13.3 % (ref 11.5–15.5)
WBC: 3.8 10*3/uL — ABNORMAL LOW (ref 4.0–10.5)

## 2020-09-19 LAB — LIPID PANEL
Cholesterol: 208 mg/dL — ABNORMAL HIGH (ref 0–200)
HDL: 37.3 mg/dL — ABNORMAL LOW (ref >39.00)
NonHDL: 171.02
Total CHOL/HDL Ratio: 6
Triglycerides: 229 mg/dL — ABNORMAL HIGH (ref 0.0–149.0)
VLDL: 45.8 mg/dL — ABNORMAL HIGH (ref 0.0–40.0)

## 2020-09-19 LAB — LDL CHOLESTEROL, DIRECT: Direct LDL: 124 mg/dL

## 2020-09-19 LAB — TSH: TSH: 2.3 u[IU]/mL (ref 0.35–4.50)

## 2020-09-26 ENCOUNTER — Encounter: Payer: Managed Care, Other (non HMO) | Admitting: Family Medicine

## 2020-10-02 ENCOUNTER — Encounter: Payer: Self-pay | Admitting: Family Medicine

## 2020-10-02 ENCOUNTER — Other Ambulatory Visit: Payer: Self-pay

## 2020-10-02 ENCOUNTER — Ambulatory Visit (INDEPENDENT_AMBULATORY_CARE_PROVIDER_SITE_OTHER): Payer: Managed Care, Other (non HMO) | Admitting: Family Medicine

## 2020-10-02 VITALS — BP 126/82 | HR 66 | Temp 96.4°F | Ht 66.5 in | Wt 174.1 lb

## 2020-10-02 DIAGNOSIS — Z0000 Encounter for general adult medical examination without abnormal findings: Secondary | ICD-10-CM | POA: Diagnosis not present

## 2020-10-02 DIAGNOSIS — E7800 Pure hypercholesterolemia, unspecified: Secondary | ICD-10-CM

## 2020-10-02 DIAGNOSIS — R519 Headache, unspecified: Secondary | ICD-10-CM | POA: Insufficient documentation

## 2020-10-02 DIAGNOSIS — E041 Nontoxic single thyroid nodule: Secondary | ICD-10-CM | POA: Insufficient documentation

## 2020-10-02 DIAGNOSIS — L989 Disorder of the skin and subcutaneous tissue, unspecified: Secondary | ICD-10-CM | POA: Diagnosis not present

## 2020-10-02 NOTE — Assessment & Plan Note (Signed)
Raised tan nevus on L chest-different than rest of her moles She desires skin screening Wears sun protection  Ref done to dermatology

## 2020-10-02 NOTE — Assessment & Plan Note (Signed)
Reviewed health habits including diet and exercise and skin cancer prevention Reviewed appropriate screening tests for age  Also reviewed health mt list, fam hx and immunization status , as well as social and family history   See HPI Labs reviewed  covid vaccinated and planning on booster Pap utd with gyn office Colonoscopy utd as well as mammogram  PHQ score 0 Ref made to derm for mole and skin screening

## 2020-10-02 NOTE — Assessment & Plan Note (Signed)
On L  Planning surgery next month to remove it (hemithyroidectomy) Sees Dr Pryor Ochoa

## 2020-10-02 NOTE — Progress Notes (Signed)
Subjective:    Patient ID: Nicole Shelton, female    DOB: [birth year], 55 y.o.   MRN: [id number]  This visit occurred during the SARS-CoV-2 public health emergency.  Safety protocols were in place, including screening questions prior to the visit, additional usage of staff PPE, and extensive cleaning of exam room while observing appropriate contact time as indicated for disinfecting solutions.    HPI Here for health maintenance exam and to review chronic medical problems    Wt Readings from Last 3 Encounters:  10/02/20 174 lb 2 oz (79 kg)  06/14/20 172 lb (78 kg)  09/26/19 171 lb 8 oz (77.8 kg)   27.68 kg/m   Having thyroid surgery in June for thyroid nodule  Dr Pryor Ochoa   More frequent headaches lately, thought due to perimenopause  Gets a headache during period  Starts in eyes (? Aura)  Both sides Constant  Can last up to 3 days  Not bad enough to keep from work  No n/v , some light sensitivity  Takes sinus headache medicine    Nothing new going on , working Feeling ok  Trying to take care of herself   covid status - immunized  Planning booster She did get covid in between doses - not too bad   Zoster status -had shingrix  Flu shot -did not get this fall  Td 9/21  Mammogram 3/22 Self breast exam -no lumps   Pap 8/20  Sees gyn , last visit in April  Did not have pap this year   Colonoscopy 9/20 with 7 y recall Polyp in the past   PHQ:0 Mood is good   BP Readings from Last 3 Encounters:  10/02/20 126/82  07/03/20 110/75  06/14/20 114/70   Pulse Readings from Last 3 Encounters:  10/02/20 66  07/03/20 68  06/14/20 72   No change in family history  Hyperlipidemia Lab Results  Component Value Date   CHOL 208 (H) 09/19/2020   CHOL 195 09/19/2019   CHOL 183 09/20/2018   Lab Results  Component Value Date   HDL 37.30 (L) 09/19/2020   HDL 39.40 09/19/2019   HDL 36.00 (L) 09/20/2018   Lab Results  Component Value Date   LDLCALC 136 (H)  09/19/2019   LDLCALC 122 (H) 09/20/2018   LDLCALC 131 (H) 09/01/2017   Lab Results  Component Value Date   TRIG 229.0 (H) 09/19/2020   TRIG 99.0 09/19/2019   TRIG 127.0 09/20/2018   Lab Results  Component Value Date   CHOLHDL 6 09/19/2020   CHOLHDL 5 09/19/2019   CHOLHDL 5 09/20/2018   Lab Results  Component Value Date   LDLDIRECT 124.0 09/19/2020   LDLDIRECT 139.5 10/06/2011   LDLDIRECT 149.0 11/27/2008   LDL is down slt HDL is still under 40  Not a lot of exercise , she is trying to fit in (she works at home)  May be able to occ fit in at lunch  Interested in yoga    Other labs Results for orders placed or performed in visit on 09/19/20  TSH  Result Value Ref Range   TSH 2.30 0.35 - 4.50 uIU/mL  Lipid panel  Result Value Ref Range   Cholesterol 208 (H) 0 - 200 mg/dL   Triglycerides 229.0 (H) 0.0 - 149.0 mg/dL   HDL 37.30 (L) >39.00 mg/dL   VLDL 45.8 (H) 0.0 - 40.0 mg/dL   Total CHOL/HDL Ratio 6    NonHDL 171.02   Comprehensive metabolic panel  Result Value Ref Range   Sodium 137 135 - 145 mEq/L   Potassium 4.6 3.5 - 5.1 mEq/L   Chloride 106 96 - 112 mEq/L   CO2 24 19 - 32 mEq/L   Glucose, Bld 84 70 - 99 mg/dL   BUN 15 6 - 23 mg/dL   Creatinine, Ser 0.89 0.40 - 1.20 mg/dL   Total Bilirubin 0.5 0.2 - 1.2 mg/dL   Alkaline Phosphatase 42 39 - 117 U/L   AST 16 0 - 37 U/L   ALT 12 0 - 35 U/L   Total Protein 7.1 6.0 - 8.3 g/dL   Albumin 4.2 3.5 - 5.2 g/dL   GFR 74.49 >60.00 mL/min   Calcium 8.8 8.4 - 10.5 mg/dL  CBC with Differential/Platelet  Result Value Ref Range   WBC 3.8 (L) 4.0 - 10.5 K/uL   RBC 4.60 3.87 - 5.11 Mil/uL   Hemoglobin 13.7 12.0 - 15.0 g/dL   HCT 38.6 36.0 - 46.0 %   MCV 83.9 78.0 - 100.0 fl   MCHC 35.3 30.0 - 36.0 g/dL   RDW 13.3 11.5 - 15.5 %   Platelets 297.0 150.0 - 400.0 K/uL   Neutrophils Relative % 60.5 43.0 - 77.0 %   Lymphocytes Relative 27.9 12.0 - 46.0 %   Monocytes Relative 11.0 3.0 - 12.0 %   Eosinophils Relative 0.2  0.0 - 5.0 %   Basophils Relative 0.4 0.0 - 3.0 %   Neutro Abs 2.3 1.4 - 7.7 K/uL   Lymphs Abs 1.1 0.7 - 4.0 K/uL   Monocytes Absolute 0.4 0.1 - 1.0 K/uL   Eosinophils Absolute 0.0 0.0 - 0.7 K/uL   Basophils Absolute 0.0 0.0 - 0.1 K/uL  LDL cholesterol, direct  Result Value Ref Range   Direct LDL 124.0 mg/dL   Patient Active Problem List   Diagnosis Date Noted  . Skin lesion 10/02/2020  . Frequent headaches 10/02/2020  . Thyroid nodule 10/02/2020  . Diarrhea 04/25/2020  . Flatulence, eructation and gas pain 04/25/2020  . Gastroesophageal reflux disease 04/25/2020  . Adenomatous colon polyp 03/02/2014  . Abdominal bloating 03/02/2014  . Routine general medical examination at a health care facility 10/06/2011  . Hyperlipidemia 04/11/2008  . IRRITABLE BOWEL SYNDROME 04/11/2008   Past Medical History:  Diagnosis Date  . Gallstones 2014  . GERD (gastroesophageal reflux disease)   . History of chicken pox   . History of recurrent UTIs   . Hyperlipemia   . IBS (irritable bowel syndrome)   . Migraines    with aura.    . Seasonal allergies   . Urinary incontinence    with laughing,coughing,sneezing   Past Surgical History:  Procedure Laterality Date  . CHOLECYSTECTOMY  2014  . COLONOSCOPY  01/03/2014  . DILATION AND CURETTAGE OF UTERUS    . MANDIBLE SURGERY    . POLYPECTOMY     Social History   Tobacco Use  . Smoking status: Never Smoker  . Smokeless tobacco: Never Used  Vaping Use  . Vaping Use: Never used  Substance Use Topics  . Alcohol use: No    Alcohol/week: 0.0 standard drinks  . Drug use: No   Family History  Problem Relation Age of Onset  . Hyperlipidemia Mother        on medication recently  . Irritable bowel syndrome Mother   . Colon polyps Mother   . Pulmonary fibrosis Father        double lung transplant  . Heart disease Maternal Grandfather   .  Bladder Cancer Maternal Grandmother   . Osteoarthritis Paternal Grandmother   . Osteoarthritis  Paternal Grandfather   . Colon cancer Neg Hx   . Esophageal cancer Neg Hx   . Pancreatic cancer Neg Hx   . Stomach cancer Neg Hx   . Liver disease Neg Hx   . Rectal cancer Neg Hx    Allergies  Allergen Reactions  . Amoxicillin Other (See Comments)    REACTION: back, leg pain  . Cetirizine Hcl     REACTION: sedation   Current Outpatient Medications on File Prior to Visit  Medication Sig Dispense Refill  . fluorouracil (EFUDEX) 5 % cream Apply topically 2 (two) times daily. 40 g 0  . Ibuprofen (ADVIL PO) Take by mouth as needed.    Marland Kitchen OVER THE COUNTER MEDICATION Caltrate with Vit D 1200 mg    . Polyethylene Glycol 3350 (MIRALAX PO) Take by mouth 3 (three) times a week.     No current facility-administered medications on file prior to visit.    Review of Systems  Constitutional: Negative for activity change, appetite change, fatigue, fever and unexpected weight change.  HENT: Negative for congestion, ear pain, rhinorrhea, sinus pressure and sore throat.   Eyes: Negative for pain, redness and visual disturbance.  Respiratory: Negative for cough, shortness of breath and wheezing.   Cardiovascular: Negative for chest pain and palpitations.  Gastrointestinal: Negative for abdominal pain, blood in stool, constipation and diarrhea.  Endocrine: Negative for polydipsia and polyuria.  Genitourinary: Negative for dysuria, frequency and urgency.  Musculoskeletal: Negative for arthralgias, back pain and myalgias.  Skin: Negative for pallor and rash.  Allergic/Immunologic: Negative for environmental allergies.  Neurological: Positive for headaches. Negative for dizziness and syncope.  Hematological: Negative for adenopathy. Does not bruise/bleed easily.  Psychiatric/Behavioral: Negative for decreased concentration and dysphoric mood. The patient is not nervous/anxious.        Objective:   Physical Exam Constitutional:      General: She is not in acute distress.    Appearance: Normal  appearance. She is well-developed and normal weight. She is not ill-appearing or diaphoretic.  HENT:     Head: Normocephalic and atraumatic.     Right Ear: Tympanic membrane, ear canal and external ear normal.     Left Ear: Tympanic membrane, ear canal and external ear normal.     Nose: Nose normal. No congestion.     Mouth/Throat:     Mouth: Mucous membranes are moist.     Pharynx: Oropharynx is clear. No posterior oropharyngeal erythema.  Eyes:     General: No scleral icterus.    Extraocular Movements: Extraocular movements intact.     Conjunctiva/sclera: Conjunctivae normal.     Pupils: Pupils are equal, round, and reactive to light.  Neck:     Thyroid: No thyromegaly.     Vascular: No carotid bruit or JVD.  Cardiovascular:     Rate and Rhythm: Normal rate and regular rhythm.     Pulses: Normal pulses.     Heart sounds: Normal heart sounds. No gallop.   Pulmonary:     Effort: Pulmonary effort is normal. No respiratory distress.     Breath sounds: Normal breath sounds. No wheezing.     Comments: Good air exch Chest:     Chest wall: No tenderness.  Abdominal:     General: Bowel sounds are normal. There is no distension or abdominal bruit.     Palpations: Abdomen is soft. There is no mass.  Tenderness: There is no abdominal tenderness.     Hernia: No hernia is present.  Genitourinary:    Comments: Breast and pelvic exams are done by gyn provider Musculoskeletal:        General: No tenderness. Normal range of motion.     Cervical back: Normal range of motion and neck supple. No rigidity. No muscular tenderness.     Right lower leg: No edema.     Left lower leg: No edema.  Lymphadenopathy:     Cervical: No cervical adenopathy.  Skin:    General: Skin is warm and dry.     Coloration: Skin is not pale.     Findings: No erythema or rash.     Comments: Fair  Solar lentigines diffusely  2-3 mm tan raised lesion on L chest  Small lump lower R back consistent with seb cyst  or lipoma (1-2 cm) and nt   Neurological:     Mental Status: She is alert. Mental status is at baseline.     Cranial Nerves: No cranial nerve deficit.     Motor: No abnormal muscle tone.     Coordination: Coordination normal.     Gait: Gait normal.     Deep Tendon Reflexes: Reflexes are normal and symmetric. Reflexes normal.  Psychiatric:        Mood and Affect: Mood normal.        Cognition and Memory: Cognition and memory normal.           Assessment & Plan:   Problem List Items Addressed This Visit      Endocrine   Thyroid nodule    On L  Planning surgery next month to remove it (hemithyroidectomy) Sees Dr Pryor Ochoa         Musculoskeletal and Integument   Skin lesion    Raised tan nevus on L chest-different than rest of her moles She desires skin screening Wears sun protection  Ref done to dermatology      Relevant Orders   Ambulatory referral to Dermatology     Other   Hyperlipidemia    Disc goals for lipids and reasons to control them Rev last labs with pt Rev low sat fat diet in detail LDL is down to 124  HDL still under 40 -enc more exercise       Routine general medical examination at a health care facility - Primary    Reviewed health habits including diet and exercise and skin cancer prevention Reviewed appropriate screening tests for age  Also reviewed health mt list, fam hx and immunization status , as well as social and family history   See HPI Labs reviewed  covid vaccinated and planning on booster Pap utd with gyn office Colonoscopy utd as well as mammogram  PHQ score 0 Ref made to derm for mole and skin screening        Frequent headaches    Given some info on chronic migraine to review  Disc lifestyle change  Disc imp of water intake, issues with caffeine  nsaid prn F/u if not improved

## 2020-10-02 NOTE — Assessment & Plan Note (Signed)
Disc goals for lipids and reasons to control them Rev last labs with pt Rev low sat fat diet in detail LDL is down to 124  HDL still under 40 -enc more exercise

## 2020-10-02 NOTE — Assessment & Plan Note (Signed)
Given some info on chronic migraine to review  Disc lifestyle change  Disc imp of water intake, issues with caffeine  nsaid prn F/u if not improved

## 2020-10-02 NOTE — Patient Instructions (Addendum)
Get your covid booster when you can  Take care of yourself   There are a lot of great yoga videos  There is a video called yoga for dummies that is great   I will place a dermatology referral - the office will call  Take a look at the info on headaches  Take aleve for headaches up to 2 pills every 12 hours with food  A little caffeine helps  Avoid daily caffeine but use for headache   Keep a headache journal and you can try an app   Come back for a headache visit when you are ready

## 2020-10-11 ENCOUNTER — Other Ambulatory Visit
Admission: RE | Admit: 2020-10-11 | Discharge: 2020-10-11 | Disposition: A | Discharge status: Home / Self Care | Payer: Managed Care, Other (non HMO) | Source: Ambulatory Visit | Attending: Otolaryngology | Admitting: Otolaryngology

## 2020-10-11 ENCOUNTER — Other Ambulatory Visit: Payer: Self-pay

## 2020-10-11 NOTE — Patient Instructions (Addendum)
Your procedure is scheduled on: October 16, 2020 TUESDAY Report to the Registration Desk on the 1st floor of the [facility]. To find out your arrival time, please call [phone] between 1PM - 3PM on: Monday October 15, 2020  REMEMBER: Instructions that are not followed completely may result in serious medical risk, up to and including death; or upon the discretion of your surgeon and anesthesiologist your surgery may need to be rescheduled.  DO NOT EAT OR DRINK after midnight the night before surgery.  No gum chewing, lozengers or hard candies.  TAKE THESE MEDICATIONS THE MORNING OF SURGERY WITH A SIP OF WATER: FAMOTIDINE (TAKE ONE THE NIGHT BEFORE AND THE MORNING OF THE SURGERY)  One week prior to surgery: Stop Anti-inflammatories (NSAIDS) such as Advil, Aleve, Ibuprofen, Motrin, Naproxen, Naprosyn and ASPIRIN OR Aspirin based products such as Excedrin, Goodys Powder, BC Powder. Stop ANY OVER THE COUNTER supplements until after surgery. You may however, continue to take Tylenol if needed for pain up until the day of surgery.  No Alcohol for 24 hours before or after surgery.  No Smoking including e-cigarettes for 24 hours prior to surgery.  No chewable tobacco products for at least 6 hours prior to surgery.  No nicotine patches on the day of surgery.  Do not use any "recreational" drugs for at least a week prior to your surgery.  Please be advised that the combination of cocaine and anesthesia may have negative outcomes, up to and including death. If you test positive for cocaine, your surgery will be cancelled.  On the morning of surgery brush your teeth with toothpaste and water, you may rinse your mouth with mouthwash if you wish. Do not swallow any toothpaste or mouthwash.  Do not wear jewelry, make-up, hairpins, clips or nail polish.  Do not wear lotions, powders, or perfumes OR DEODORANT    Do not shave body from the neck down 48 hours prior to surgery just in case you cut  yourself which could leave a site for infection.  Also, freshly shaved skin may become irritated if using the CHG soap.  Contact lenses, hearing aids and dentures may not be worn into surgery.  Do not bring valuables to the hospital. [facility] is not responsible for any missing/lost belongings or valuables.   SHOWER MORNING OF SURGERY.  Notify your doctor if there is any change in your medical condition (cold, fever, infection).  Wear comfortable clothing (specific to your surgery type) to the hospital.  Plan for stool softeners for home use; pain medications have a tendency to cause constipation. You can also help prevent constipation by eating foods high in fiber such as fruits and vegetables and drinking plenty of fluids as your diet allows.  After surgery, you can help prevent lung complications by doing breathing exercises.  Take deep breaths and cough every 1-2 hours. Your doctor may order a device called an Incentive Spirometer to help you take deep breaths. When coughing or sneezing, hold a pillow firmly against your incision with both hands. This is called "splinting." Doing this helps protect your incision. It also decreases belly discomfort.  If you are being discharged the day of surgery, you will not be allowed to drive home. You will need a responsible adult (18 years or older) to drive you home and stay with you that night.   If you are taking public transportation, you will need to have a responsible adult (18 years or older) with you. Please confirm with your  physician that it is acceptable to use public transportation.   Please call the [facility] at [phone] if you have any questions about these instructions.  Surgery Visitation Policy:  Patients undergoing a surgery or procedure may have one family member or support person with them as long as that person is not COVID-19 positive or experiencing its symptoms.  That person may remain in the  waiting area during the procedure.  Inpatient Visitation:    Visiting hours are 7 a.m. to 8 p.m. Inpatients will be allowed two visitors daily. The visitors may change each day during the patient's stay. No visitors under the age of 62. Any visitor under the age of 42 must be accompanied by an adult. The visitor must pass COVID-19 screenings, use hand sanitizer when entering and exiting the patient's room and wear a mask at all times, including in the patient's room. Patients must also wear a mask when staff or their visitor are in the room. Masking is required regardless of vaccination status.  Total Hip/Knee Replacement Preoperative Educational Video  To better prepare for surgery, please view our videos that explain the physical activity and discharge planning required to have the best surgical recovery at [facility].  [email or web address]      Questions? Call [phone] or email [email or web address]

## 2020-10-16 ENCOUNTER — Other Ambulatory Visit: Payer: Self-pay

## 2020-10-16 ENCOUNTER — Ambulatory Visit: Payer: Managed Care, Other (non HMO)

## 2020-10-16 ENCOUNTER — Ambulatory Visit
Admission: RE | Admit: 2020-10-16 | Discharge: 2020-10-16 | Disposition: A | Discharge status: Home / Self Care | Payer: Managed Care, Other (non HMO) | Attending: Otolaryngology | Admitting: Otolaryngology

## 2020-10-16 ENCOUNTER — Encounter: Payer: Self-pay | Admitting: Otolaryngology

## 2020-10-16 ENCOUNTER — Encounter
Admission: RE | Disposition: A | Discharge status: Home / Self Care | Payer: Self-pay | Source: Home / Self Care | Attending: Otolaryngology

## 2020-10-16 DIAGNOSIS — C73 Malignant neoplasm of thyroid gland: Secondary | ICD-10-CM | POA: Insufficient documentation

## 2020-10-16 DIAGNOSIS — Z880 Allergy status to penicillin: Secondary | ICD-10-CM | POA: Insufficient documentation

## 2020-10-16 DIAGNOSIS — E041 Nontoxic single thyroid nodule: Secondary | ICD-10-CM | POA: Diagnosis present

## 2020-10-16 DIAGNOSIS — Z881 Allergy status to other antibiotic agents status: Secondary | ICD-10-CM | POA: Insufficient documentation

## 2020-10-16 DIAGNOSIS — E079 Disorder of thyroid, unspecified: Secondary | ICD-10-CM

## 2020-10-16 HISTORY — PX: THYROIDECTOMY: SHX17

## 2020-10-16 HISTORY — DX: Disorder of thyroid, unspecified: E07.9

## 2020-10-16 LAB — POCT PREGNANCY, URINE: Preg Test, Ur: NEGATIVE

## 2020-10-16 SURGERY — THYROIDECTOMY
Anesthesia: General | Laterality: Left

## 2020-10-16 MED ORDER — DEXMEDETOMIDINE (PRECEDEX) IN NS 20 MCG/5ML (4 MCG/ML) IV SYRINGE
PREFILLED_SYRINGE | INTRAVENOUS | Status: DC | PRN
  Administered 2020-10-16: 8 ug via INTRAVENOUS

## 2020-10-16 MED ORDER — LACTATED RINGERS IV SOLN: INTRAVENOUS | Status: DC

## 2020-10-16 MED ORDER — PHENYLEPHRINE HCL (PRESSORS) 10 MG/ML IV SOLN
INTRAVENOUS | Status: AC
  Filled 2020-10-16: qty 1

## 2020-10-16 MED ORDER — SEVOFLURANE IN SOLN
RESPIRATORY_TRACT | Status: AC
  Filled 2020-10-16: qty 250

## 2020-10-16 MED ORDER — MIDAZOLAM HCL 2 MG/2ML IJ SOLN
INTRAMUSCULAR | Status: DC | PRN
  Administered 2020-10-16: 2 mg via INTRAVENOUS

## 2020-10-16 MED ORDER — SUCCINYLCHOLINE CHLORIDE 200 MG/10ML IV SOSY
PREFILLED_SYRINGE | INTRAVENOUS | Status: AC
  Filled 2020-10-16: qty 10

## 2020-10-16 MED ORDER — LIDOCAINE HCL (CARDIAC) PF 100 MG/5ML IV SOSY
PREFILLED_SYRINGE | INTRAVENOUS | Status: DC | PRN
  Administered 2020-10-16: 80 mg via INTRAVENOUS

## 2020-10-16 MED ORDER — ORAL CARE MOUTH RINSE: 15.0000 mL | Freq: Once | OROMUCOSAL | Status: AC

## 2020-10-16 MED ORDER — SODIUM CHLORIDE 0.9 % IV SOLN
INTRAVENOUS | Status: DC | PRN
  Administered 2020-10-16: 50 ug/min via INTRAVENOUS

## 2020-10-16 MED ORDER — CHLORHEXIDINE GLUCONATE 0.12 % MT SOLN: 15.0000 mL | Freq: Once | OROMUCOSAL | Status: AC

## 2020-10-16 MED ORDER — BUPIVACAINE-EPINEPHRINE (PF) 0.25% -1:200000 IJ SOLN
INTRAMUSCULAR | Status: AC
  Filled 2020-10-16: qty 30

## 2020-10-16 MED ORDER — ACETAMINOPHEN 10 MG/ML IV SOLN
INTRAVENOUS | Status: AC
  Filled 2020-10-16: qty 100

## 2020-10-16 MED ORDER — DEXAMETHASONE SODIUM PHOSPHATE 10 MG/ML IJ SOLN
INTRAMUSCULAR | Status: DC | PRN
  Administered 2020-10-16: 10 mg via INTRAVENOUS

## 2020-10-16 MED ORDER — ONDANSETRON HCL 4 MG/2ML IJ SOLN: 4.0000 mg | Freq: Once | INTRAMUSCULAR | Status: DC | PRN

## 2020-10-16 MED ORDER — PHENYLEPHRINE HCL (PRESSORS) 10 MG/ML IV SOLN
INTRAVENOUS | Status: DC | PRN
  Administered 2020-10-16 (×3): 100 ug via INTRAVENOUS

## 2020-10-16 MED ORDER — MEPERIDINE HCL 25 MG/ML IJ SOLN: 6.2500 mg | INTRAMUSCULAR | Status: DC | PRN

## 2020-10-16 MED ORDER — DEXAMETHASONE SODIUM PHOSPHATE 10 MG/ML IJ SOLN
INTRAMUSCULAR | Status: AC
  Filled 2020-10-16: qty 1

## 2020-10-16 MED ORDER — MIDAZOLAM HCL 2 MG/2ML IJ SOLN
INTRAMUSCULAR | Status: AC
  Filled 2020-10-16: qty 2

## 2020-10-16 MED ORDER — LIDOCAINE HCL (PF) 2 % IJ SOLN
INTRAMUSCULAR | Status: AC
  Filled 2020-10-16: qty 5

## 2020-10-16 MED ORDER — ONDANSETRON HCL 4 MG/2ML IJ SOLN
INTRAMUSCULAR | Status: DC | PRN
  Administered 2020-10-16: 4 mg via INTRAVENOUS

## 2020-10-16 MED ORDER — ONDANSETRON HCL 4 MG PO TABS: 4.0000 mg | ORAL_TABLET | Freq: Three times a day (TID) | ORAL | 0 refills | Status: DC | PRN

## 2020-10-16 MED ORDER — FENTANYL CITRATE (PF) 100 MCG/2ML IJ SOLN
INTRAMUSCULAR | Status: DC | PRN
  Administered 2020-10-16 (×2): 50 ug via INTRAVENOUS

## 2020-10-16 MED ORDER — SUCCINYLCHOLINE CHLORIDE 20 MG/ML IJ SOLN
INTRAMUSCULAR | Status: DC | PRN
  Administered 2020-10-16: 100 mg via INTRAVENOUS

## 2020-10-16 MED ORDER — EPHEDRINE 5 MG/ML INJ
INTRAVENOUS | Status: AC
  Filled 2020-10-16: qty 10

## 2020-10-16 MED ORDER — FENTANYL CITRATE (PF) 100 MCG/2ML IJ SOLN
25.0000 ug | INTRAMUSCULAR | Status: DC | PRN
  Administered 2020-10-16: 25 ug via INTRAVENOUS

## 2020-10-16 MED ORDER — ACETAMINOPHEN 10 MG/ML IV SOLN
INTRAVENOUS | Status: DC | PRN
  Administered 2020-10-16: 1000 mg via INTRAVENOUS

## 2020-10-16 MED ORDER — BUPIVACAINE-EPINEPHRINE (PF) 0.25% -1:200000 IJ SOLN
INTRAMUSCULAR | Status: DC | PRN
  Administered 2020-10-16: 5 mL via PERINEURAL

## 2020-10-16 MED ORDER — BACITRACIN ZINC 500 UNIT/GM EX OINT
TOPICAL_OINTMENT | CUTANEOUS | Status: AC
  Filled 2020-10-16: qty 28.35

## 2020-10-16 MED ORDER — ONDANSETRON HCL 4 MG/2ML IJ SOLN
INTRAMUSCULAR | Status: AC
  Filled 2020-10-16: qty 2

## 2020-10-16 MED ORDER — PROPOFOL 10 MG/ML IV BOLUS
INTRAVENOUS | Status: DC | PRN
  Administered 2020-10-16: 150 mg via INTRAVENOUS

## 2020-10-16 MED ORDER — PROPOFOL 10 MG/ML IV BOLUS
INTRAVENOUS | Status: AC
  Filled 2020-10-16: qty 40

## 2020-10-16 MED ORDER — HYDROCODONE-ACETAMINOPHEN 5-325 MG PO TABS: 1.0000 | ORAL_TABLET | ORAL | 0 refills | Status: DC | PRN

## 2020-10-16 MED ORDER — FENTANYL CITRATE (PF) 100 MCG/2ML IJ SOLN
INTRAMUSCULAR | Status: AC
  Filled 2020-10-16: qty 2

## 2020-10-16 MED ORDER — CHLORHEXIDINE GLUCONATE 0.12 % MT SOLN
OROMUCOSAL | Status: AC
  Administered 2020-10-16: 15 mL via OROMUCOSAL
  Filled 2020-10-16: qty 15

## 2020-10-16 SURGICAL SUPPLY — 46 items
ADH SKN CLS APL DERMABOND .7 (GAUZE/BANDAGES/DRESSINGS) ×1
BLADE SURG 15 STRL LF DISP TIS (BLADE) ×1 IMPLANT
BLADE SURG 15 STRL SS (BLADE) ×3
BULB RESERV EVAC DRAIN JP 100C (MISCELLANEOUS) IMPLANT
CANISTER SUCT 1200ML W/VALVE (MISCELLANEOUS) ×3 IMPLANT
CLOSURE WOUND 1/2 X4 (GAUZE/BANDAGES/DRESSINGS) ×1
CLOSURE WOUND 1/4X4 (GAUZE/BANDAGES/DRESSINGS)
CORD BIP STRL DISP 12FT (MISCELLANEOUS) ×3 IMPLANT
COVER WAND RF STERILE (DRAPES) ×3 IMPLANT
DERMABOND ADVANCED (GAUZE/BANDAGES/DRESSINGS) ×2
DERMABOND ADVANCED .7 DNX12 (GAUZE/BANDAGES/DRESSINGS) IMPLANT
DRAIN JP 10F RND SILICONE (MISCELLANEOUS) IMPLANT
DRAPE MAG INST 16X20 L/F (DRAPES) ×3 IMPLANT
DRSG TEGADERM 2-3/8X2-3/4 SM (GAUZE/BANDAGES/DRESSINGS) IMPLANT
ELECT CAUTERY BLADE TIP 2.5 (TIP) ×3
ELECT LARYNGEAL 6/7 (MISCELLANEOUS)
ELECT LARYNGEAL 8/9 (MISCELLANEOUS)
ELECT REM PT RETURN 9FT ADLT (ELECTROSURGICAL) ×3
ELECTRODE CAUTERY BLDE TIP 2.5 (TIP) IMPLANT
ELECTRODE LARYNGEAL 6/7 (MISCELLANEOUS) IMPLANT
ELECTRODE LARYNGEAL 8/9 (MISCELLANEOUS) IMPLANT
ELECTRODE REM PT RTRN 9FT ADLT (ELECTROSURGICAL) ×1 IMPLANT
FORCEPS JEWEL BIP 4-3/4 STR (INSTRUMENTS) ×3 IMPLANT
GAUZE 4X4 16PLY RFD (DISPOSABLE) IMPLANT
GLOVE SURG ENC MOIS LTX SZ7.5 (GLOVE) ×6 IMPLANT
GOWN STRL REUS W/ TWL LRG LVL3 (GOWN DISPOSABLE) ×3 IMPLANT
GOWN STRL REUS W/TWL LRG LVL3 (GOWN DISPOSABLE) ×9
HEMOSTAT SURGICEL 2X3 (HEMOSTASIS) ×3 IMPLANT
HOOK STAY BLUNT/RETRACTOR 5M (MISCELLANEOUS) IMPLANT
KIT TURNOVER KIT A (KITS) ×3 IMPLANT
LABEL OR SOLS (LABEL) ×3 IMPLANT
MANIFOLD NEPTUNE II (INSTRUMENTS) ×3 IMPLANT
NS IRRIG 500ML POUR BTL (IV SOLUTION) ×3 IMPLANT
PACK HEAD/NECK (MISCELLANEOUS) ×3 IMPLANT
PROBE NEUROSIGN BIPOL (MISCELLANEOUS) ×1 IMPLANT
PROBE NEUROSIGN BIPOLAR (MISCELLANEOUS) ×3
SHEARS HARMONIC 9CM CVD (BLADE) ×3 IMPLANT
SPONGE KITTNER 5P (MISCELLANEOUS) ×3 IMPLANT
STRIP CLOSURE SKIN 1/2X4 (GAUZE/BANDAGES/DRESSINGS) ×1 IMPLANT
STRIP CLOSURE SKIN 1/4X4 (GAUZE/BANDAGES/DRESSINGS) IMPLANT
SUT PROLENE 6 0 P 1 18 (SUTURE) ×3 IMPLANT
SUT SILK 2 0 (SUTURE) ×3
SUT SILK 2 0 SH (SUTURE) ×3 IMPLANT
SUT SILK 2-0 18XBRD TIE 12 (SUTURE) ×1 IMPLANT
SUT VIC AB 4-0 RB1 18 (SUTURE) ×3 IMPLANT
SYSTEM CHEST DRAIN TLS 7FR (DRAIN) IMPLANT

## 2020-10-16 NOTE — Anesthesia Preprocedure Evaluation (Signed)
Anesthesia Evaluation  Patient identified by MRN, date of birth, ID band Patient awake    Reviewed: Allergy & Precautions, NPO status , Patient's Chart, lab work & pertinent test results  Airway Mallampati: II  TM Distance: >3 FB Neck ROM: Full    Dental no notable dental hx.    Pulmonary neg pulmonary ROS,    Pulmonary exam normal        Cardiovascular negative cardio ROS Normal cardiovascular exam     Neuro/Psych  Headaches, Migranes negative psych ROS   GI/Hepatic Neg liver ROS, GERD  Medicated,IBS   Endo/Other  negative endocrine ROS  Renal/GU negative Renal ROS  negative genitourinary   Musculoskeletal negative musculoskeletal ROS (+)   Abdominal   Peds negative pediatric ROS (+)  Hematology negative hematology ROS (+)   Anesthesia Other Findings Gallstones 2014   GERD (gastroesophageal reflux disease)    History of chicken pox    History of recurrent UTIs    Hyperlipemia    IBS (irritable bowel syndrome)    Migraines  with aura.   Seasonal allergies    Urinary incontinence       Reproductive/Obstetrics negative OB ROS                            Anesthesia Physical Anesthesia Plan  ASA: 2  Anesthesia Plan: General   Post-op Pain Management:    Induction: Intravenous  PONV Risk Score and Plan: 3 and Ondansetron  Airway Management Planned: Oral ETT  Additional Equipment:   Intra-op Plan:   Post-operative Plan: Extubation in OR  Informed Consent: I have reviewed the patients History and Physical, chart, labs and discussed the procedure including the risks, benefits and alternatives for the proposed anesthesia with the patient or authorized representative who has indicated his/her understanding and acceptance.       Plan Discussed with: CRNA, Anesthesiologist and Surgeon  Anesthesia Plan Comments:         Anesthesia Quick Evaluation

## 2020-10-16 NOTE — Op Note (Signed)
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  Description of Procedure:  After the patient was identified in hold and the history and physical and consent was reviewed and updated.  The patient was marked in an upright position on the left side along a natural occuring skin crease.  The patient was next taken to the operating room and placed in a supine position.  General endotracheal anesthesia was induced with laryngeal monitor endotracheal tube.  Direct visualization by the surgeon of the tube electrodes in contact with the vocal cords was made..  The patient's anterior marked neck crease was neck injected with 5cc's of 0.25mg  Marcaine with 1:100,000 Epinephrine.  The patient was next prepped and draped in a sterile normal fashion.  At this time, a 15 blade scalpel was used to make a skin incision along a previously marked anterior neck crease.  Dissection was carefully performed through the subcutaneous tissues with combination of Bovie electrocautery and blunt dissection.  The platysma was incised and anterior neck veins ligated with harmonic scalpel.  The median raphe of the strap muscles was divided in a linear fashion with Bovie electrocautery until the anterior border of the thyroid gland was identified.  The sternohyoid muscle was bluntly dissected away from the large left hemithyroid gland.  The lateral border of the thyroid and the carotid artery was identified.  Dissection bluntly and with Bovie electrocautery was continued superiorly and inferiorly along the lateral  edge of the thyroid.  The superior vessels were identified laterally and then medially with blunt dissection in Joel's space between the larynx and the superior thyroid pole.  Further dissection was continued inferiorly as well.  Once the superior pole was pedicled, the superior thyroid vessels were ligated with Harmonic Scalpel.  The large left hemithyroid was delivered from the wound after it was enlarged approximately 1cm on the left.  The large left thyroid nodule once delivered from the wound revealed Berry's ligament and the nodule of Zuckerkandl.  Just beneath this nodule, the recurrent laryngeal nerve was identified and stimulated with movement of the arytenoid joint.  The inferior parathyroid was identified adjacent to the nerve along with the superior parathyroid as well.  These were identified and revealed good vascularization.  The nerve was tracked until its insertion into the larynx.  Next, the remaining attachments of Berry's ligament was divided and the isthmus was divided with Harmonic Scalpel.  Care was taken to avoid disruption of the left thyroid nodule that partially was within the thyroid isthmus.  The wound was copiously irrigated with sterile saline.  Meticulous hemostasis with bipolar was obtained.  Visualization of an intact left recurrent laryngeal nerve that stimulated was made.  The parathyroid glands were intact and well perfused.  Surgicel was placed in the wound bed.  The wound was then closed in a multilayered fashion with vicryl for subcutaneous tissues and skin closed with dermabond and topped with a steri-strip.  At this time the patient was extubated and taken to PACU in good condition.  Plan:  Follow pathology.  Limit activity for 2 weeks.  Follow up  next week for post-operative evaluation.  Nicole Shelton  10/16/2020 8:59 AM

## 2020-10-16 NOTE — Transfer of Care (Signed)
Immediate Anesthesia Transfer of Care Note  Patient: Nicole Shelton  Procedure(s) Performed: HEMI THYROIDECTOMY (Left)  Patient Location: PACU  Anesthesia Type:General  Level of Consciousness: drowsy  Airway & Oxygen Therapy: Patient Spontanous Breathing and Patient connected to face mask oxygen  Post-op Assessment: Report given to RN and Post -op Vital signs reviewed and stable  Post vital signs: Reviewed and stable  Last Vitals:  Vitals Value Taken Time  BP 103/48 10/16/20 0910  Temp    Pulse 77 10/16/20 0914  Resp 16 10/16/20 0914  SpO2 100 % 10/16/20 0914  Vitals shown include unvalidated device data.  Last Pain:  Vitals:   10/16/20 0651  TempSrc: Oral  PainSc: 0-No pain         Complications: No notable events documented.

## 2020-10-16 NOTE — Discharge Instructions (Signed)

## 2020-10-16 NOTE — Anesthesia Procedure Notes (Addendum)
Procedure Name: Intubation Date/Time: 10/16/2020 7:31 AM Performed by: Phill Mutter, MD Pre-anesthesia Checklist: Patient identified, Emergency Drugs available, Suction available and Patient being monitored Patient Re-evaluated:Patient Re-evaluated prior to induction Oxygen Delivery Method: Circle system utilized Preoxygenation: Pre-oxygenation with 100% oxygen Induction Type: IV induction Ventilation: Mask ventilation without difficulty Laryngoscope Size: McGraph and 3 Grade View: Grade I Tube type: Oral Number of attempts: 1 Airway Equipment and Method: Stylet and Oral airway Placement Confirmation: ETT inserted through vocal cords under direct vision, positive ETCO2 and breath sounds checked- equal and bilateral Tube secured with: Tape Dental Injury: Teeth and Oropharynx as per pre-operative assessment

## 2020-10-16 NOTE — Anesthesia Postprocedure Evaluation (Signed)
Anesthesia Post Note  Patient: Nicole Shelton  Procedure(s) Performed: HEMI THYROIDECTOMY (Left)  Patient location during evaluation: PACU Anesthesia Type: General Level of consciousness: awake and alert, awake and oriented Pain management: pain level controlled Vital Signs Assessment: post-procedure vital signs reviewed and stable Respiratory status: spontaneous breathing, nonlabored ventilation and respiratory function stable Cardiovascular status: blood pressure returned to baseline and stable Postop Assessment: no apparent nausea or vomiting Anesthetic complications: no   No notable events documented.   Last Vitals:  Vitals:   10/16/20 1003 10/16/20 1015  BP:  125/67  Pulse: 80 66  Resp: 17 16  Temp:  (!) 36.1 C  SpO2: 99% 100%    Last Pain:  Vitals:   10/16/20 1015  TempSrc: Temporal  PainSc: 4                  Phill Mutter

## 2020-10-16 NOTE — H&P (Signed)
..  History and Physical paper copy reviewed and updated date of procedure and will be scanned into system.  Patient seen and examined and marked for left hemithyroidectomy.

## 2020-10-19 ENCOUNTER — Other Ambulatory Visit: Payer: Self-pay | Admitting: Pathology

## 2020-10-19 LAB — SURGICAL PATHOLOGY

## 2020-10-25 ENCOUNTER — Other Ambulatory Visit: Payer: Managed Care, Other (non HMO)

## 2020-10-26 NOTE — Progress Notes (Signed)
Tumor Board Documentation  Nicole Shelton was presented by Dr Pryor Ochoa at our Tumor Board on 10/25/2020, which included representatives from radiation oncology, medical oncology, surgical, radiology, pathology, navigation, internal medicine, palliative care, research, genetics, pharmacy, pulmonology.  [name] currently presents for [facility], for new positive pathology, as an external consult with history of the following treatments: surgical intervention(s).  Additionally, we reviewed previous medical and familial history, history of present illness, and recent lab results along with all available histopathologic and imaging studies. The tumor board considered available treatment options and made the following recommendations:   Observation vs Thyroid Lobectomy, patient to decide what she wants to do  The following procedures/referrals were also placed: No orders of the defined types were placed in this encounter.   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  Tumor board is a meeting of clinicians from various specialty areas who evaluate and discuss patients for whom a multidisciplinary approach is being considered. Final determinations in the plan of care are those of the provider(s). The responsibility for follow up of recommendations given during tumor board is that of the provider.   Today's extended care, comprehensive team conference, [name] was not present for the discussion and was not examined.   Multidisciplinary Tumor Board is a multidisciplinary case peer review process.  Decisions discussed in the Multidisciplinary Tumor Board reflect the opinions of the specialists present at the conference without having examined the patient.  Ultimately, treatment and diagnostic decisions rest with the  primary provider(s) and the patient.

## 2021-02-18 ENCOUNTER — Other Ambulatory Visit: Payer: Self-pay

## 2021-02-18 ENCOUNTER — Ambulatory Visit (INDEPENDENT_AMBULATORY_CARE_PROVIDER_SITE_OTHER): Payer: Managed Care, Other (non HMO) | Admitting: Dermatology

## 2021-02-18 DIAGNOSIS — L578 Other skin changes due to chronic exposure to nonionizing radiation: Secondary | ICD-10-CM | POA: Diagnosis not present

## 2021-02-18 DIAGNOSIS — D485 Neoplasm of uncertain behavior of skin: Secondary | ICD-10-CM

## 2021-02-18 DIAGNOSIS — Z809 Family history of malignant neoplasm, unspecified: Secondary | ICD-10-CM | POA: Diagnosis not present

## 2021-02-18 DIAGNOSIS — D239 Other benign neoplasm of skin, unspecified: Secondary | ICD-10-CM | POA: Diagnosis not present

## 2021-02-18 DIAGNOSIS — D2362 Other benign neoplasm of skin of left upper limb, including shoulder: Secondary | ICD-10-CM

## 2021-02-18 NOTE — Progress Notes (Signed)
New Patient Visit  Subjective  Nicole Shelton is a 55 y.o. female who presents for the following: Skin Problem (Patient here today for a spot at left chest, present for about 1 year. Asymptomatic. Also a spot at left hand, present for a little over 1 year. It was larger but has gotten smaller and now has a white spot on it. Patient with no personal hx of skin cancer, there is a fhx of skin cancer but patient unsure what type. ).  The following portions of the chart were reviewed this encounter and updated as appropriate:   Tobacco  Allergies  Meds  Problems  Med Hx  Surg Hx  Fam Hx     Review of Systems:  No other skin or systemic complaints except as noted in HPI or Assessment and Plan.  Objective  Well appearing patient in no apparent distress; mood and affect are within normal limits.  A focused examination was performed including chest, left hand. Relevant physical exam findings are noted in the Assessment and Plan.  left chest 0.5 cm firm pink/brown papulenodule with dimple sign.   Left Dorsum Hand 0.6 cm hyperkeratotic papule with central dell Nevus vs SCC    Assessment & Plan  Dermatofibroma left chest  Benign-appearing.  Observation.  Call clinic for new or changing lesions.  Recommend daily use of broad spectrum spf 30+ sunscreen to sun-exposed areas.    Neoplasm of uncertain behavior of skin Left Dorsum Hand  Epidermal / dermal shaving  Lesion diameter (cm):  0.6 Informed consent: discussed and consent obtained   Timeout: patient name, date of birth, surgical site, and procedure verified   Procedure prep:  Patient was prepped and draped in usual sterile fashion Prep type:  Isopropyl alcohol Anesthesia: the lesion was anesthetized in a standard fashion   Anesthetic:  1% lidocaine w/ epinephrine 1-100,000 buffered w/ 8.4% NaHCO3 Instrument used: flexible razor blade   Hemostasis achieved with: pressure, aluminum chloride and electrodesiccation    Outcome: patient tolerated procedure well   Post-procedure details: sterile dressing applied and wound care instructions given   Dressing type: bandage and petrolatum    Destruction of lesion Complexity: extensive   Destruction method: electrodesiccation and curettage   Informed consent: discussed and consent obtained   Timeout:  patient name, date of birth, surgical site, and procedure verified Procedure prep:  Patient was prepped and draped in usual sterile fashion Prep type:  Isopropyl alcohol Anesthesia: the lesion was anesthetized in a standard fashion   Anesthetic:  1% lidocaine w/ epinephrine 1-100,000 buffered w/ 8.4% NaHCO3 Curettage performed in three different directions: Yes   Electrodesiccation performed over the curetted area: Yes   Lesion length (cm):  0.6 Lesion width (cm):  0.6 Margin per side (cm):  0.2 Final wound size (cm):  1 Hemostasis achieved with:  pressure, aluminum chloride and electrodesiccation Outcome: patient tolerated procedure well with no complications   Post-procedure details: sterile dressing applied and wound care instructions given   Dressing type: bandage and petrolatum    Specimen 1 - Surgical pathology Differential Diagnosis: Nevus vs SCC  Check Margins: No 0.6 cm hyperkeratotic papule with central dell  Actinic Damage - chronic, secondary to cumulative UV radiation exposure/sun exposure over time - diffuse scaly erythematous macules with underlying dyspigmentation - Recommend daily broad spectrum sunscreen SPF 30+ to sun-exposed areas, reapply every 2 hours as needed.  - Recommend staying in the shade or wearing long sleeves, sun glasses (UVA+UVB protection) and wide brim hats (  4-inch brim around the entire circumference of the hat). - Call for new or changing lesions.  Return if symptoms worsen or fail to improve.  Graciella Belton, RMA, am acting as scribe for Sarina Ser, MD . Documentation: I have reviewed the above  documentation for accuracy and completeness, and I agree with the above.  Sarina Ser, MD

## 2021-02-18 NOTE — Patient Instructions (Addendum)
Wound Care Instructions  Cleanse wound gently with soap and water once a day then pat dry with clean gauze. Apply a thing coat of Petrolatum (petroleum jelly, "Vaseline") over the wound (unless you have an allergy to this). We recommend that you use a new, sterile tube of Vaseline. Do not pick or remove scabs. Do not remove the yellow or white "healing tissue" from the base of the wound.  Cover the wound with fresh, clean, nonstick gauze and secure with paper tape. You may use Band-Aids in place of gauze and tape if the would is small enough, but would recommend trimming much of the tape off as there is often too much. Sometimes Band-Aids can irritate the skin.  You should call the office for your biopsy report after 1 week if you have not already been contacted.  If you experience any problems, such as abnormal amounts of bleeding, swelling, significant bruising, significant pain, or evidence of infection, please call the office immediately.  FOR ADULT SURGERY PATIENTS: If you need something for pain relief you may take 1 extra strength Tylenol (acetaminophen) AND 2 Ibuprofen (200mg each) together every 4 hours as needed for pain. (do not take these if you are allergic to them or if you have a reason you should not take them.) Typically, you may only need pain medication for 1 to 3 days.   If you have any questions or concerns for your doctor, please call our main line at [phone] and press option 4 to reach your doctor's medical assistant. If no one answers, please leave a voicemail as directed and we will return your call as soon as possible. Messages left after 4 pm will be answered the following business day.   You may also send us a message via MyChart. We typically respond to MyChart messages within 1-2 business days.  For prescription refills, please ask your pharmacy to contact our office. Our fax number is [phone].  If you have an urgent issue when the clinic is closed that  cannot wait until the next business day, you can page your doctor at the number below.    Please note that while we do our best to be available for urgent issues outside of office hours, we are not available 24/7.   If you have an urgent issue and are unable to reach us, you may choose to seek medical care at your doctor's office, retail clinic, urgent care center, or emergency room.  If you have a medical emergency, please immediately call 911 or go to the emergency department.  Pager Numbers  - Dr. Kowalski: [phone]  - Dr. Moye: [phone]  - Dr. Stewart: [phone]  In the event of inclement weather, please call our main line at [phone] for an update on the status of any delays or closures.  Dermatology Medication Tips: Please keep the boxes that topical medications come in in order to help keep track of the instructions about where and how to use these. Pharmacies typically print the medication instructions only on the boxes and not directly on the medication tubes.   If your medication is too expensive, please contact our office at [phone] option 4 or send us a message through MyChart.   We are unable to tell what your co-pay for medications will be in advance as this is different depending on your insurance coverage. However, we may be able to find a substitute medication at lower cost or fill out paperwork to get insurance to cover a needed   medication.   If a prior authorization is required to get your medication covered by your insurance company, please allow us 1-2 business days to complete this process.  Drug prices often vary depending on where the prescription is filled and some pharmacies may offer cheaper prices.  The website www.goodrx.com contains coupons for medications through different pharmacies. The prices here do not account for what the cost may be with help from insurance (it may be cheaper with your insurance), but the website can give you the  price if you did not use any insurance.  - You can print the associated coupon and take it with your prescription to the pharmacy.  - You may also stop by our office during regular business hours and pick up a GoodRx coupon card.  - If you need your prescription sent electronically to a different pharmacy, notify our office through [facility] MyChart or by phone at [phone] option 4.   

## 2021-02-19 ENCOUNTER — Encounter: Payer: Self-pay | Admitting: Dermatology

## 2021-02-20 ENCOUNTER — Telehealth: Payer: Self-pay

## 2021-02-20 NOTE — Telephone Encounter (Signed)
-----   Message from Ralene Bathe, MD sent at 02/19/2021  7:06 PM EDT ----- Diagnosis Skin , left dorsum hand DERMATOFIBROMA, BASE INVOLVED  Benign dermatofibroma No further treatment needed

## 2021-02-20 NOTE — Telephone Encounter (Signed)
Patient informed of pathology results 

## 2021-06-19 NOTE — Progress Notes (Signed)
56 y.o. G82P1011 Married Caucasian female here for annual exam.    Menses are regular.  Feels a little more warm during her menses.   New dx of thyroid cancer and had partial thyroidectomy.  Followed by Dr. Honor Junes.   PCP:  Loura Pardon, MD    Patient's last menstrual period was 06/10/2021 (exact date).     Period Cycle (Days): 30 Period Duration (Days): 3 Period Pattern: Regular Menstrual Flow:  (First day heavy then tapers) Menstrual Control: Maxi pad Menstrual Control Change Freq (Hours): changes maxi pad every 6-8 hours on heaviest day Dysmenorrhea: (!) Mild Dysmenorrhea Symptoms: Cramping, Headache (passes clots with first day)     Sexually active: Yes.    The current method of family planning is vasectomy.    Exercising: Yes.     walking Smoker:  no  Health Maintenance: Pap:  12-14-18 Neg:Neg HR HPV, 06-27-15 Neg:Neg HR HPV  History of abnormal Pap:  no MMG:  07-09-20 Neg/Birads1 Colonoscopy:  01-17-19 polyps removed;next 7 years BMD:   n/a  Result  n/a TDaP:  10-04-19 Gardasil:   n/a HIV: Neg in preg Hep C:no Screening Labs:  PCP and endocrinology.  Flu vaccine:  declined.  Covid vaccines:  original series.    reports that she has never smoked. She has never used smokeless tobacco. She reports that she does not drink alcohol and does not use drugs.  Past Medical History:  Diagnosis Date   Cancer ([place])    Gallstones 05/05/2012   GERD (gastroesophageal reflux disease)    History of chicken pox    History of recurrent UTIs    Hyperlipemia    IBS (irritable bowel syndrome)    Migraines    with aura.     Seasonal allergies    Thyroid disease 10/16/2020   thyroid nodule removed--cancer   Urinary incontinence    with laughing,coughing,sneezing    Past Surgical History:  Procedure Laterality Date   CHOLECYSTECTOMY  2014   COLONOSCOPY  01/03/2014   DILATION AND CURETTAGE OF UTERUS     MANDIBLE SURGERY  1985   POLYPECTOMY     THYROIDECTOMY Left 10/16/2020    Procedure: HEMI THYROIDECTOMY;  Surgeon: Carloyn Manner, MD;  Location: [facility] ORS;  Service: ENT;  Laterality: Left;    Current Outpatient Medications  Medication Sig Dispense Refill   acetaminophen (TYLENOL) 500 MG tablet Take 1,000 mg by mouth every 6 (six) hours as needed (for pain).     ascorbic acid (VITAMIN C) 500 MG tablet Take by mouth.     Calcium-Magnesium-Vitamin D (CITRACAL SLOW RELEASE PO) Take 1 tablet by mouth in the morning.     fluorouracil (EFUDEX) 5 % cream Apply topically 2 (two) times daily. (Patient taking differently: Apply 1 application topically 2 (two) times daily as needed (affected area of foot).) 40 g 0   loratadine (CLARITIN) 10 MG tablet Take 10 mg by mouth daily.     magnesium oxide (MAG-OX) 400 MG tablet Take by mouth.     polyethylene glycol (MIRALAX / GLYCOLAX) 17 g packet Take 17 g by mouth 3 (three) times a week.     Sod Fluoride-Potassium Nitrate 1.1-5 % PSTE Place onto teeth.     No current facility-administered medications for this visit.    Family History  Problem Relation Age of Onset   Hyperlipidemia Mother        on medication recently   Irritable bowel syndrome Mother    Colon polyps Mother    Pulmonary fibrosis  Father        double lung transplant   Heart disease Maternal Grandfather    Bladder Cancer Maternal Grandmother    Osteoarthritis Paternal Grandmother    Osteoarthritis Paternal Grandfather    Colon cancer Neg Hx    Esophageal cancer Neg Hx    Pancreatic cancer Neg Hx    Stomach cancer Neg Hx    Liver disease Neg Hx    Rectal cancer Neg Hx     Review of Systems  All other systems reviewed and are negative.  Exam:   BP 110/68    Pulse 85    Ht 5' 6.5" (1.689 m)    Wt 176 lb (79.8 kg)    LMP 06/10/2021 (Exact Date)    SpO2 100%    BMI 27.98 kg/m     General appearance: alert, cooperative and appears stated age Head: normocephalic, without obvious abnormality, atraumatic Neck: no adenopathy, supple, symmetrical, trachea  midline and thyroid normal to inspection and palpation Lungs: clear to auscultation bilaterally Breasts: normal appearance, no masses or tenderness, No nipple retraction or dimpling, No nipple discharge or bleeding, No axillary adenopathy Heart: regular rate and rhythm Abdomen: soft, non-tender; no masses, no organomegaly Extremities: extremities normal, atraumatic, no cyanosis or edema Skin: skin color, texture, turgor normal. No rashes or lesions Lymph nodes: cervical, supraclavicular, and axillary nodes normal. Neurologic: grossly normal  Pelvic: External genitalia:  no lesions              No abnormal inguinal nodes palpated.              Urethra:  normal appearing urethra with no masses, tenderness or lesions              Bartholins and Skenes: normal                 Vagina: normal appearing vagina with normal color and discharge, no lesions              Cervix: no lesions              Pap taken: no Bimanual Exam:  Uterus:  normal size, contour, position, consistency, mobility, non-tender              Adnexa: no mass, fullness, tenderness              Rectal exam: yes.  Confirms.              Anus:  normal sphincter tone, no lesions  Chaperone was present for exam:  Estill Bamberg, CMA  Assessment:   Well woman visit with gynecologic exam. Hx migraine with aura.  Hx thyroid cancer.  Status post partial thyroidectomy.   Plan: Mammogram screening discussed. Self breast awareness reviewed. Pap and HR HPV as above. Guidelines for Calcium, Vitamin D, regular exercise program including cardiovascular and weight bearing exercise. Labs with PCP.   Follow up annually and prn.   After visit summary provided.

## 2021-06-20 ENCOUNTER — Encounter: Payer: Self-pay | Admitting: Obstetrics and Gynecology

## 2021-06-20 ENCOUNTER — Ambulatory Visit (INDEPENDENT_AMBULATORY_CARE_PROVIDER_SITE_OTHER): Payer: Managed Care, Other (non HMO) | Admitting: Obstetrics and Gynecology

## 2021-06-20 ENCOUNTER — Other Ambulatory Visit: Payer: Self-pay

## 2021-06-20 VITALS — BP 110/68 | HR 85 | Ht 66.5 in | Wt 176.0 lb

## 2021-06-20 DIAGNOSIS — Z01419 Encounter for gynecological examination (general) (routine) without abnormal findings: Secondary | ICD-10-CM

## 2021-06-20 NOTE — Patient Instructions (Signed)

## 2021-07-12 ENCOUNTER — Encounter: Payer: Self-pay | Admitting: Obstetrics and Gynecology

## 2021-10-02 ENCOUNTER — Telehealth (INDEPENDENT_AMBULATORY_CARE_PROVIDER_SITE_OTHER): Payer: Managed Care, Other (non HMO) | Admitting: Family Medicine

## 2021-10-02 ENCOUNTER — Other Ambulatory Visit (INDEPENDENT_AMBULATORY_CARE_PROVIDER_SITE_OTHER): Payer: Managed Care, Other (non HMO)

## 2021-10-02 DIAGNOSIS — Z0000 Encounter for general adult medical examination without abnormal findings: Secondary | ICD-10-CM | POA: Diagnosis not present

## 2021-10-02 DIAGNOSIS — E7800 Pure hypercholesterolemia, unspecified: Secondary | ICD-10-CM | POA: Diagnosis not present

## 2021-10-02 LAB — LIPID PANEL
Cholesterol: 219 mg/dL — ABNORMAL HIGH (ref 0–200)
HDL: 45.5 mg/dL (ref >39.00)
LDL Cholesterol: 145 mg/dL — ABNORMAL HIGH (ref 0–99)
NonHDL: 173.7
Total CHOL/HDL Ratio: 5
Triglycerides: 144 mg/dL (ref 0.0–149.0)
VLDL: 28.8 mg/dL (ref 0.0–40.0)

## 2021-10-02 LAB — CBC WITH DIFFERENTIAL/PLATELET
Basophils Absolute: 0 10*3/uL (ref 0.0–0.1)
Basophils Relative: 0.4 % (ref 0.0–3.0)
Eosinophils Absolute: 0 10*3/uL (ref 0.0–0.7)
Eosinophils Relative: 0.1 % (ref 0.0–5.0)
HCT: 36.9 % (ref 36.0–46.0)
Hemoglobin: 12.8 g/dL (ref 12.0–15.0)
Lymphocytes Relative: 26.4 % (ref 12.0–46.0)
Lymphs Abs: 1 10*3/uL (ref 0.7–4.0)
MCHC: 34.7 g/dL (ref 30.0–36.0)
MCV: 85.2 fl (ref 78.0–100.0)
Monocytes Absolute: 0.4 10*3/uL (ref 0.1–1.0)
Monocytes Relative: 10.1 % (ref 3.0–12.0)
Neutro Abs: 2.4 10*3/uL (ref 1.4–7.7)
Neutrophils Relative %: 63 % (ref 43.0–77.0)
Platelets: 263 10*3/uL (ref 150.0–400.0)
RBC: 4.32 Mil/uL (ref 3.87–5.11)
RDW: 13.4 % (ref 11.5–15.5)
WBC: 3.9 10*3/uL — ABNORMAL LOW (ref 4.0–10.5)

## 2021-10-02 LAB — COMPREHENSIVE METABOLIC PANEL
ALT: 14 U/L (ref 0–35)
AST: 18 U/L (ref 0–37)
Albumin: 3.7 g/dL (ref 3.5–5.2)
Alkaline Phosphatase: 39 U/L (ref 39–117)
BUN: 14 mg/dL (ref 6–23)
CO2: 26 mEq/L (ref 19–32)
Calcium: 8.1 mg/dL — ABNORMAL LOW (ref 8.4–10.5)
Chloride: 107 mEq/L (ref 96–112)
Creatinine, Ser: 0.84 mg/dL (ref 0.40–1.20)
GFR: 79.26 mL/min (ref >60.00)
Glucose, Bld: 79 mg/dL (ref 70–99)
Potassium: 4.2 mEq/L (ref 3.5–5.1)
Sodium: 139 mEq/L (ref 135–145)
Total Bilirubin: 0.6 mg/dL (ref 0.2–1.2)
Total Protein: 6.4 g/dL (ref 6.0–8.3)

## 2021-10-02 LAB — TSH: TSH: 2.99 u[IU]/mL (ref 0.35–5.50)

## 2021-10-02 NOTE — Telephone Encounter (Signed)
-----   Message from Ellamae Sia sent at 10/02/2021  7:35 AM EDT ----- Regarding: lab orders for now Patient is scheduled for CPX labs, please order future labs, Thanks , Karna Christmas

## 2021-10-10 ENCOUNTER — Ambulatory Visit (INDEPENDENT_AMBULATORY_CARE_PROVIDER_SITE_OTHER): Payer: Managed Care, Other (non HMO) | Admitting: Family Medicine

## 2021-10-10 ENCOUNTER — Encounter: Payer: Self-pay | Admitting: Family Medicine

## 2021-10-10 VITALS — BP 118/68 | HR 57 | Ht 67.0 in | Wt 180.2 lb

## 2021-10-10 DIAGNOSIS — E041 Nontoxic single thyroid nodule: Secondary | ICD-10-CM

## 2021-10-10 DIAGNOSIS — Z85850 Personal history of malignant neoplasm of thyroid: Secondary | ICD-10-CM | POA: Insufficient documentation

## 2021-10-10 DIAGNOSIS — E7800 Pure hypercholesterolemia, unspecified: Secondary | ICD-10-CM

## 2021-10-10 DIAGNOSIS — Z0000 Encounter for general adult medical examination without abnormal findings: Secondary | ICD-10-CM

## 2021-10-10 NOTE — Assessment & Plan Note (Signed)
Reviewed health habits including diet and exercise and skin cancer prevention Reviewed appropriate screening tests for age  Also reviewed health mt list, fam hx and immunization status , as well as social and family history   Pt is concerned with wt loss- rev low glycemic diet to try with exercise  Mammogram utd Pap utd Colonoscopy utd Nl PHQ Did well with thyroid surgery  Labs rev

## 2021-10-10 NOTE — Assessment & Plan Note (Signed)
bx revealed cancer  Doing well under specialist care and fully removed  No c/o  Continues endocrinology follow up

## 2021-10-10 NOTE — Progress Notes (Signed)
Subjective:    Patient ID: Lynnell Dike, female    DOB: December 12, 1965, 56 y.o.   MRN: [id number]  HPI Here for health maintenance exam and to review chronic medical problems    Wt Readings from Last 3 Encounters:  10/10/21 180 lb 3.2 oz (81.7 kg)  06/20/21 176 lb (79.8 kg)  10/16/20 173 lb 15.1 oz (78.9 kg)   28.22 kg/m  Feeling ok Thyroid surgery went ok  Has gained a lot of weight / been through a lot and really tired  Interested in loosing weight  Wt watchers helped in the past   Drinks some diet soda and water   Too many sweets  Not a lot of snack food  Some bread No rice Occ pasta    Exercise :  Not a lot  She used to walk 3-5 per week    Immunization History  Administered Date(s) Administered   Influenza Inj Mdck Quad Pf 02/03/2017   Influenza, Seasonal, Injecte, Preservative Fre 05/09/2015   Influenza,inj,Quad PF,6+ Mos 02/12/2018   Influenza-Unspecified 03/06/2014, 02/03/2017, 01/28/2018   PFIZER Comirnaty(Gray Top)Covid-19 Tri-Sucrose Vaccine 01/20/2020   PFIZER(Purple Top)SARS-COV-2 Vaccination 12/01/2019   Tdap 10/08/2011, 10/04/2019   Zoster Recombinat (Shingrix) 10/04/2019, 01/20/2020    Mammogram 07/2021 Self breast exam : no lumps   Pap 12/2018  neg with neg HPV screen Gyn Dr Quincy Simmonds- feb  She is still having periods but skips some and   Supplements : citrical , vit C- needs to be better about that   Colonoscopy 01/2019 Recall in 7 y, history of polyps   pHQ    10/10/2021    9:57 AM 10/02/2020    9:56 AM 10/02/2020    8:11 AM 09/26/2019    8:55 AM 09/23/2018    7:54 PM  Depression screen PHQ 2/9  Decreased Interest 0 0 0 0 0  Down, Depressed, Hopeless 0 0 0 0 0  PHQ - 2 Score 0 0 0 0 0  Altered sleeping    0   Tired, decreased energy    0   Change in appetite    0   Feeling bad or failure about yourself     0   Trouble concentrating    0   Moving slowly or fidgety/restless    0   Suicidal thoughts    0   PHQ-9 Score    0    Difficult doing work/chores    Not difficult at all       Had a hemithyroidectomy for thyroid cancer  Lab Results  Component Value Date   TSH 2.99 10/02/2021  Ok with endo as well    Hyperlipidemia  Lab Results  Component Value Date   CHOL 219 (H) 10/02/2021   CHOL 208 (H) 09/19/2020   CHOL 195 09/19/2019   Lab Results  Component Value Date   HDL 45.50 10/02/2021   HDL 37.30 (L) 09/19/2020   HDL 39.40 09/19/2019   Lab Results  Component Value Date   LDLCALC 145 (H) 10/02/2021   LDLCALC 136 (H) 09/19/2019   LDLCALC 122 (H) 09/20/2018   Lab Results  Component Value Date   TRIG 144.0 10/02/2021   TRIG 229.0 (H) 09/19/2020   TRIG 99.0 09/19/2019   Lab Results  Component Value Date   CHOLHDL 5 10/02/2021   CHOLHDL 6 09/19/2020   CHOLHDL 5 09/19/2019   Lab Results  Component Value Date   LDLDIRECT 124.0 09/19/2020   LDLDIRECT 139.5 10/06/2011   LDLDIRECT  149.0 11/27/2008   Cholesterol is up  Mother has high cholesterol  Not a lot of fried foods   The 10-year ASCVD risk score (Arnett DK, et al., 2019) is: 4.9%   Values used to calculate the score:     Age: 20 years     Sex: Female     Is Non-Hispanic African American: No     Diabetic: No     Tobacco smoker: Yes     Systolic Blood Pressure: 182 mmHg     Is BP treated: No     HDL Cholesterol: 45.5 mg/dL     Total Cholesterol: 219 mg/dL      Lab Results  Component Value Date   WBC 3.9 (L) 10/02/2021   HGB 12.8 10/02/2021   HCT 36.9 10/02/2021   MCV 85.2 10/02/2021   PLT 263.0 10/02/2021   Lab Results  Component Value Date   TSH 2.99 10/02/2021   Lab Results  Component Value Date   CREATININE 0.84 10/02/2021   BUN 14 10/02/2021   NA 139 10/02/2021   K 4.2 10/02/2021   CL 107 10/02/2021   CO2 26 10/02/2021   Lab Results  Component Value Date   ALT 14 10/02/2021   AST 18 10/02/2021   ALKPHOS 39 10/02/2021   BILITOT 0.6 10/02/2021     Patient Active Problem List   Diagnosis Date  Noted   History of thyroid cancer 10/10/2021   Frequent headaches 10/02/2020   Thyroid nodule 10/02/2020   Gastroesophageal reflux disease 04/25/2020   Adenomatous colon polyp 03/02/2014   Routine general medical examination at a health care facility 10/06/2011   Hyperlipidemia 04/11/2008   IRRITABLE BOWEL SYNDROME 04/11/2008   Past Medical History:  Diagnosis Date   Cancer ([place])    Gallstones 05/05/2012   GERD (gastroesophageal reflux disease)    History of chicken pox    History of recurrent UTIs    Hyperlipemia    IBS (irritable bowel syndrome)    Migraines    with aura.     Seasonal allergies    Thyroid disease 10/16/2020   thyroid nodule removed--cancer   Urinary incontinence    with laughing,coughing,sneezing   Past Surgical History:  Procedure Laterality Date   CHOLECYSTECTOMY  2014   COLONOSCOPY  01/03/2014   DILATION AND CURETTAGE OF UTERUS     MANDIBLE SURGERY  1985   POLYPECTOMY     THYROIDECTOMY Left 10/16/2020   Procedure: HEMI THYROIDECTOMY;  Surgeon: Carloyn Manner, MD;  Location: [facility] OR[facility];  Service: ENT;  Laterality: Left;   Social History   Tobacco Use   Smoking status: Never   Smokeless tobacco: Never  Vaping Use   Vaping Use: Never used  Substance Use Topics   Alcohol use: No    Alcohol/week: 0.0 standard drinks of alcohol   Drug use: No   Family History  Problem Relation Age of Onset   Hyperlipidemia Mother        on medication recently   Irritable bowel syndrome Mother    Colon polyps Mother    Pulmonary fibrosis Father        double lung transplant   Heart disease Maternal Grandfather    Bladder Cancer Maternal Grandmother    Osteoarthritis Paternal Grandmother    Osteoarthritis Paternal Grandfather    Colon cancer Neg Hx    Esophageal cancer Neg Hx    Pancreatic cancer Neg Hx    Stomach cancer Neg Hx    Liver disease Neg Hx  Rectal cancer Neg Hx    Allergies  Allergen Reactions   Amoxicillin Other (See Comments)     back, leg pain   Cetirizine Hcl Other (See Comments)    sedation   Current Outpatient Medications on File Prior to Visit  Medication Sig Dispense Refill   acetaminophen (TYLENOL) 500 MG tablet Take 1,000 mg by mouth every 6 (six) hours as needed (for pain).     ascorbic acid (VITAMIN C) 500 MG tablet Take by mouth.     Calcium-Magnesium-Vitamin D (CITRACAL SLOW RELEASE PO) Take 1 tablet by mouth in the morning.     fluorouracil (EFUDEX) 5 % cream Apply topically 2 (two) times daily. (Patient taking differently: Apply 1 application. topically 2 (two) times daily as needed (affected area of foot).) 40 g 0   loratadine (CLARITIN) 10 MG tablet Take 10 mg by mouth daily.     magnesium oxide (MAG-OX) 400 MG tablet Take by mouth.     polyethylene glycol (MIRALAX / GLYCOLAX) 17 g packet Take 17 g by mouth 3 (three) times a week.     Sod Fluoride-Potassium Nitrate 1.1-5 % PSTE Place onto teeth.     No current facility-administered medications on file prior to visit.    Review of Systems  Constitutional:  Positive for fatigue and unexpected weight change. Negative for activity change, appetite change and fever.  HENT:  Negative for congestion, ear pain, rhinorrhea, sinus pressure and sore throat.   Eyes:  Negative for pain, redness and visual disturbance.  Respiratory:  Negative for cough, shortness of breath and wheezing.   Cardiovascular:  Negative for chest pain and palpitations.  Gastrointestinal:  Negative for abdominal pain, blood in stool, constipation and diarrhea.  Endocrine: Negative for polydipsia and polyuria.  Genitourinary:  Negative for dysuria, frequency and urgency.  Musculoskeletal:  Negative for arthralgias, back pain and myalgias.  Skin:  Negative for pallor and rash.  Allergic/Immunologic: Negative for environmental allergies.  Neurological:  Negative for dizziness, syncope and headaches.  Hematological:  Negative for adenopathy. Does not bruise/bleed easily.   Psychiatric/Behavioral:  Negative for decreased concentration and dysphoric mood. The patient is not nervous/anxious.        Objective:   Physical Exam Constitutional:      General: She is not in acute distress.    Appearance: Normal appearance. She is well-developed and normal weight. She is not ill-appearing or diaphoretic.     Comments: Wt gain noted  HENT:     Head: Normocephalic and atraumatic.     Right Ear: Tympanic membrane, ear canal and external ear normal.     Left Ear: Tympanic membrane, ear canal and external ear normal.     Nose: Nose normal. No congestion.     Mouth/Throat:     Mouth: Mucous membranes are moist.     Pharynx: Oropharynx is clear. No posterior oropharyngeal erythema.  Eyes:     General: No scleral icterus.    Extraocular Movements: Extraocular movements intact.     Conjunctiva/sclera: Conjunctivae normal.     Pupils: Pupils are equal, round, and reactive to light.  Neck:     Thyroid: No thyromegaly.     Vascular: No carotid bruit or JVD.     Comments: Thyroid scar is well healed Cardiovascular:     Rate and Rhythm: Normal rate and regular rhythm.     Pulses: Normal pulses.     Heart sounds: Normal heart sounds.     No gallop.  Pulmonary:  Effort: Pulmonary effort is normal. No respiratory distress.     Breath sounds: Normal breath sounds. No wheezing.     Comments: Good air exch Chest:     Chest wall: No tenderness.  Abdominal:     General: Bowel sounds are normal. There is no distension or abdominal bruit.     Palpations: Abdomen is soft. There is no mass.     Tenderness: There is no abdominal tenderness.     Hernia: No hernia is present.  Genitourinary:    Comments: Breast and pelvic exam done by gyn Musculoskeletal:        General: No tenderness. Normal range of motion.     Cervical back: Normal range of motion and neck supple. No rigidity. No muscular tenderness.     Right lower leg: No edema.     Left lower leg: No edema.      Comments: No kyphosis   Lymphadenopathy:     Cervical: No cervical adenopathy.  Skin:    General: Skin is warm and dry.     Coloration: Skin is not pale.     Findings: No erythema or rash.     Comments: Solar lentigines diffusely   Neurological:     Mental Status: She is alert. Mental status is at baseline.     Cranial Nerves: No cranial nerve deficit.     Motor: No abnormal muscle tone.     Coordination: Coordination normal.     Gait: Gait normal.     Deep Tendon Reflexes: Reflexes are normal and symmetric. Reflexes normal.  Psychiatric:        Mood and Affect: Mood normal.        Cognition and Memory: Cognition and memory normal.           Assessment & Plan:   Problem List Items Addressed This Visit       Endocrine   Thyroid nodule    bx revealed cancer  Doing well under specialist care and fully removed  No c/o  Continues endocrinology follow up         Other   History of thyroid cancer    Doing well after thyroidectomy      Hyperlipidemia    Disc goals for lipids and reasons to control them Rev last labs with pt Rev low sat fat diet in detail LDL is up mildly but so is HDL May be genetic Working on diet  Consider statin if no imp      Routine general medical examination at a health care facility - Primary    Reviewed health habits including diet and exercise and skin cancer prevention Reviewed appropriate screening tests for age  Also reviewed health mt list, fam hx and immunization status , as well as social and family history   Pt is concerned with wt loss- rev low glycemic diet to try with exercise  Mammogram utd Pap utd Colonoscopy utd Nl PHQ Did well with thyroid surgery  Labs rev

## 2021-10-10 NOTE — Assessment & Plan Note (Signed)
Doing well after thyroidectomy

## 2021-10-10 NOTE — Assessment & Plan Note (Signed)
Disc goals for lipids and reasons to control them Rev last labs with pt Rev low sat fat diet in detail LDL is up mildly but so is HDL May be genetic Working on diet  Consider statin if no imp

## 2021-10-10 NOTE — Patient Instructions (Addendum)
Try to get most of your carbohydrates from produce (with the exception of white potatoes)  Eat less bread/pasta/rice/snack foods/cereals/sweets and other items from the middle of the grocery store (processed carbs)  Weight watchers is a good option   Diet soda is ok / but drink mostly water   Make exercise a priority and schedule it  Walk outside  Find an indoor alternative  30 or more minutes per day   Make sure you get your calcium and D  Try to get 1200-1500 mg of calcium per day with at least 1000 iu of vitamin D - for bone health   For cholesterol Avoid red meat/ fried foods/ egg yolks/ fatty breakfast meats/ butter, cheese and high fat dairy/ and shellfish

## 2021-12-19 IMAGING — US US THYROID
1 series · 13 of 25 positions shown · non-contrast
Comparison: None.

CLINICAL DATA: 54-year-old female with thyroid nodules

EXAM:
THYROID ULTRASOUND
TECHNIQUE: Ultrasound examination of the thyroid gland and adjacent soft
tissues was performed.

[Series 1: us thyroid · 0.07mm/px · 37 acquisitions, 13 frames shown]
[im 1/37]
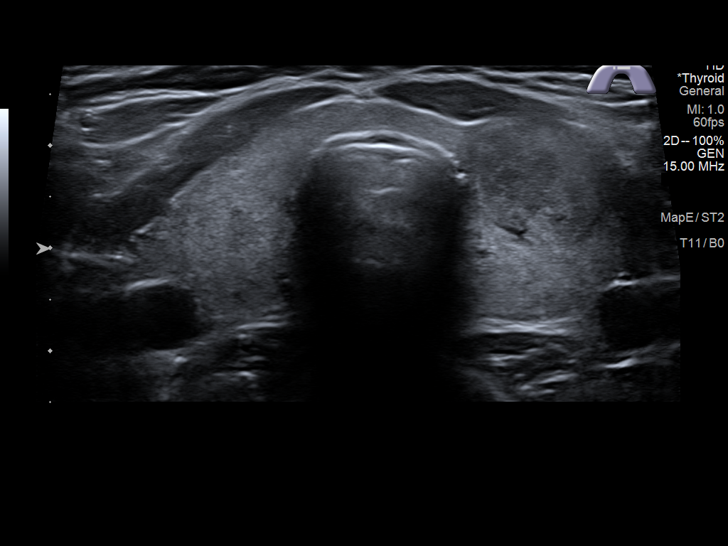
[im 4/37]
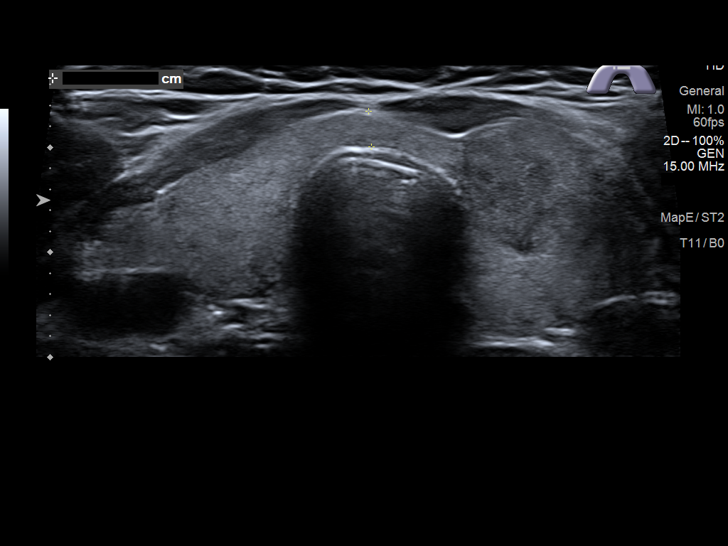
[im 7/37]
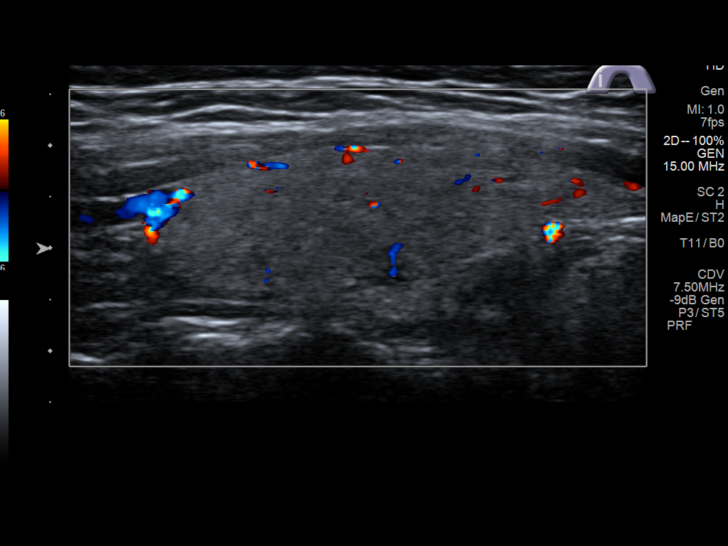
[im 10/37]
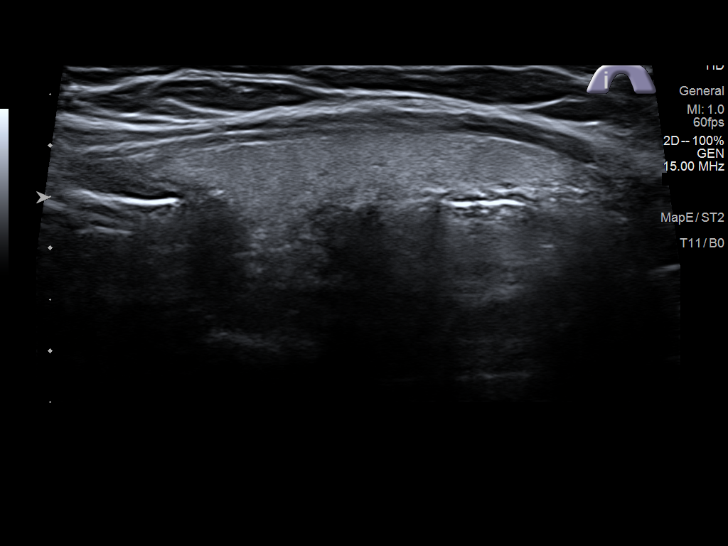
[im 13/37]
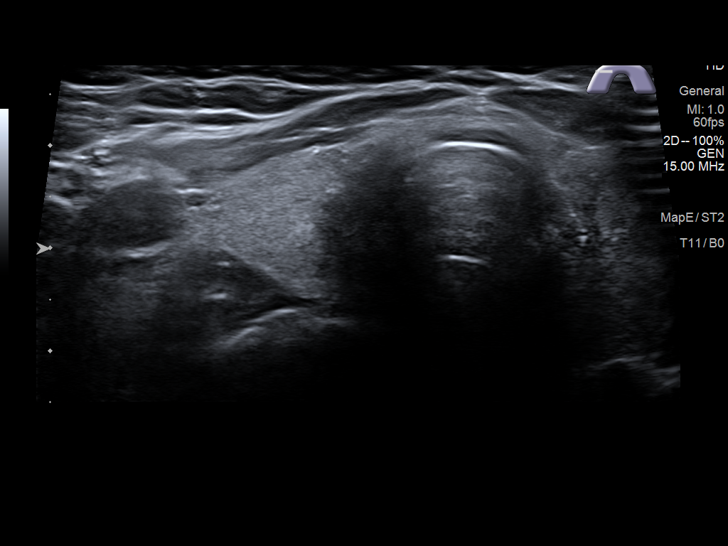
[im 16/37]
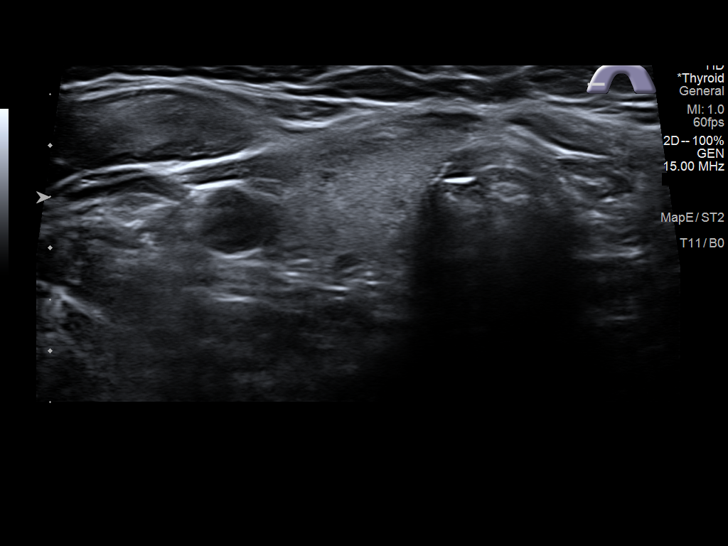
[im 19/37]
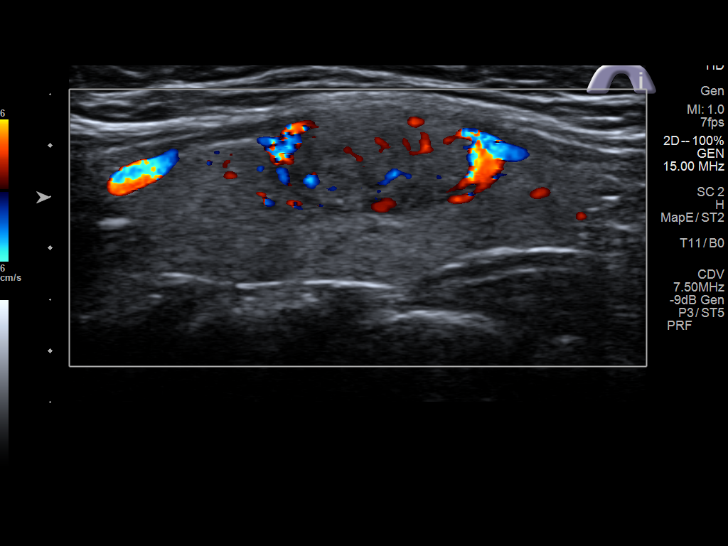
[im 22/37]
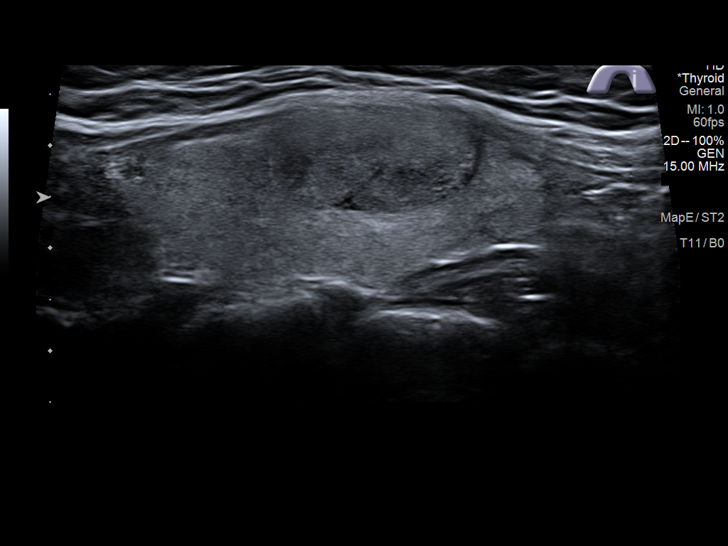
[im 25/37]
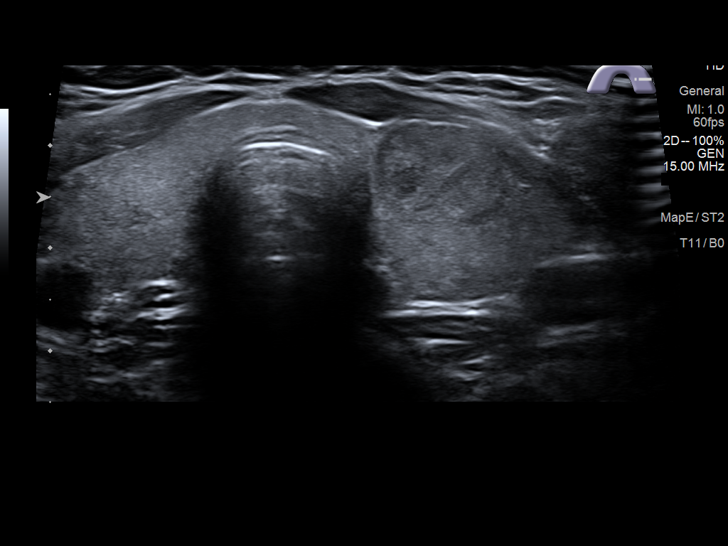
[im 28/37]
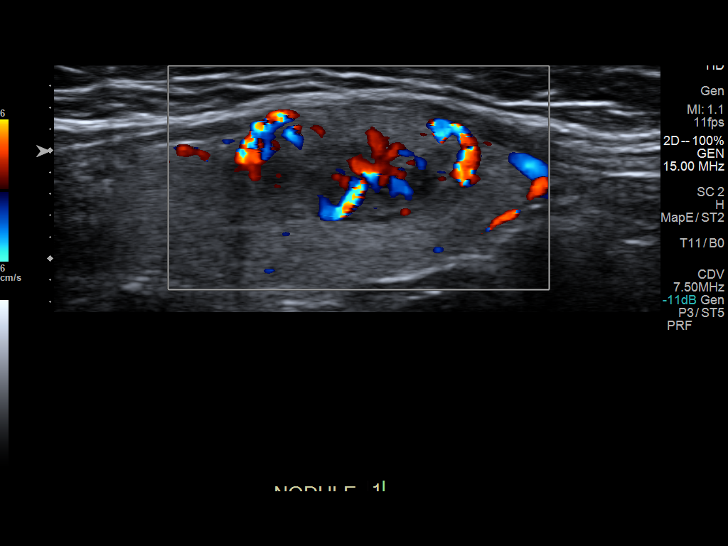
[im 31/37]
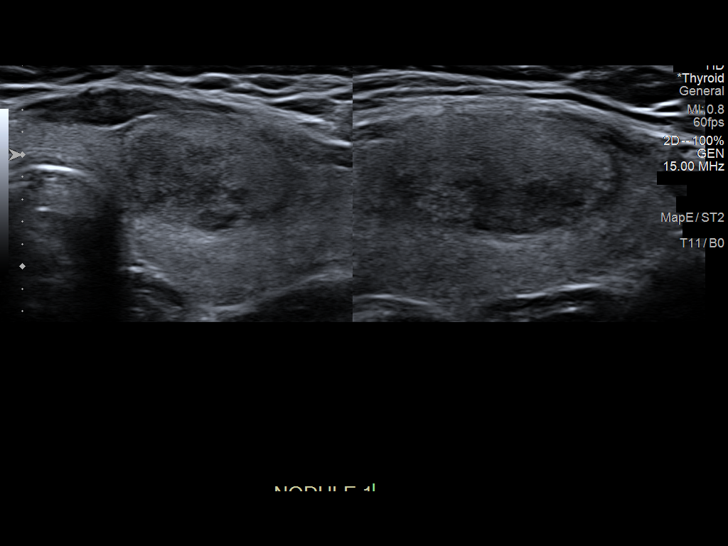
[im 34/37]
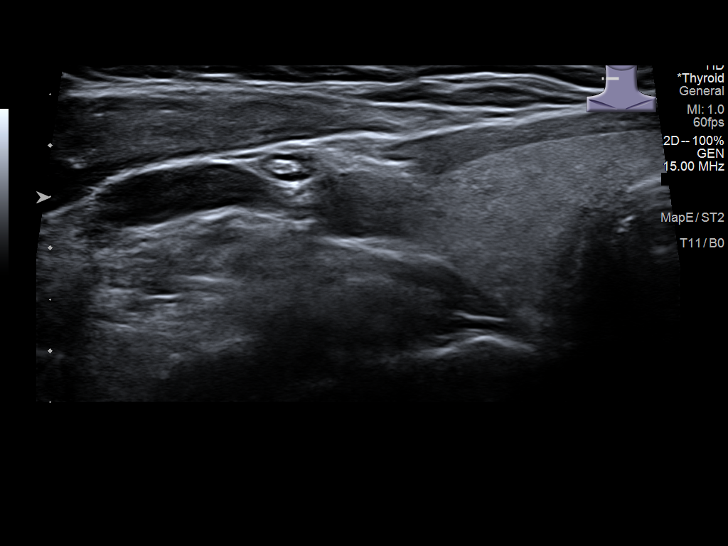
[im 37/37]
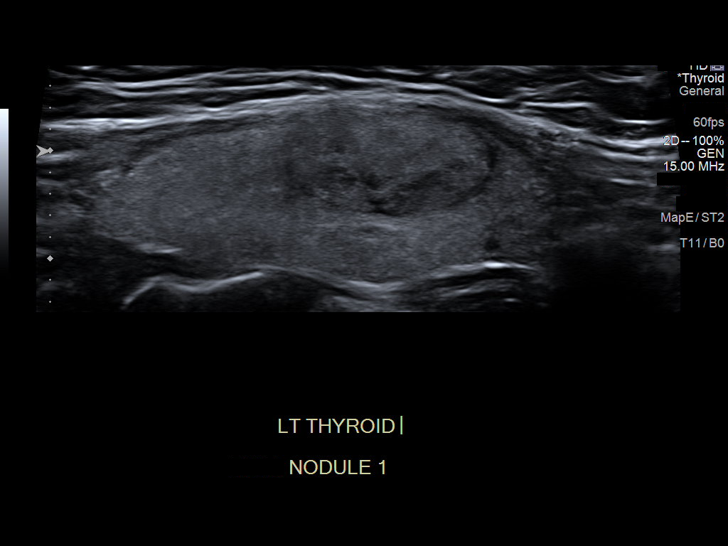

[13 of 25 positions shown; findings below may reference images not displayed]

FINDINGS: Parenchymal Echotexture: Mildly heterogenous

Isthmus: 0.3 cm

Right lobe: 4.9 cm x 1.7 cm x 1.7 cm

Left lobe: 5.0 cm x 1.7 cm x 1.9 cm

_________________________________________________________

Estimated total number of nodules >/= 1 cm: 1

Number of spongiform nodules >/=  2 cm not described below (TR1): 0

Number of mixed cystic and solid nodules >/= 1.5 cm not described
below (TR2): 0

_________________________________________________________

Nodule # 1:

Location: Left; Mid

Maximum size: 2.1 cm; Other 2 dimensions: 1.4 cm x 1.1 cm

Composition: solid/almost completely solid (2)

Echogenicity: hypoechoic (2)

Shape: not taller-than-wide (0)

Margins: ill-defined (0)

Echogenic foci: none (0)

ACR TI-RADS total points: 4.

ACR TI-RADS risk category: TR4 (4-6 points).

ACR TI-RADS recommendations:

Nodule meets criteria for biopsy

_________________________________________________________

No adenopathy
IMPRESSION: Left mid thyroid nodule meets criteria for biopsy, as designated by
the newly established ACR TI-RADS criteria, and referral for biopsy
is recommended.

Recommendations follow those established by the new ACR TI-RADS
criteria ([HOSPITAL] 6494;[DATE]).

## 2021-12-24 IMAGING — US US FNA BIOPSY THYROID 1ST LESION
1 series · 11 of 11 positions shown · non-contrast
Comparison: Ultrasound thyroid dated June 28, 2020

MEDICATIONS:
Lidocaine 1% 2 mL

COMPLICATIONS:
None immediate.

INDICATION: Indeterminate thyroid nodule

EXAM:
ULTRASOUND GUIDED FINE NEEDLE ASPIRATION OF INDETERMINATE THYROID
NODULE
TECHNIQUE: Informed written consent was obtained from the patient after a
discussion of the risks, benefits and alternatives to treatment.
Questions regarding the procedure were encouraged and answered. A
timeout was performed prior to the initiation of the procedure.

[Series 1: us thyroid biopsy · 11 of 11 slices shown]
[im 1/11]
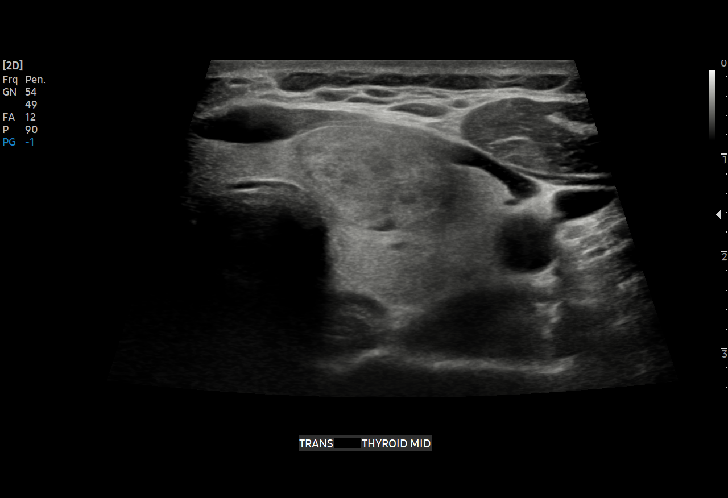
[im 2/11]
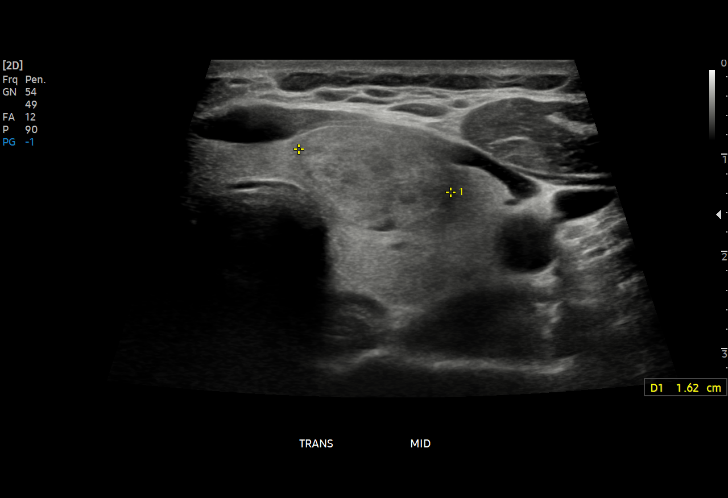
[im 3/11]
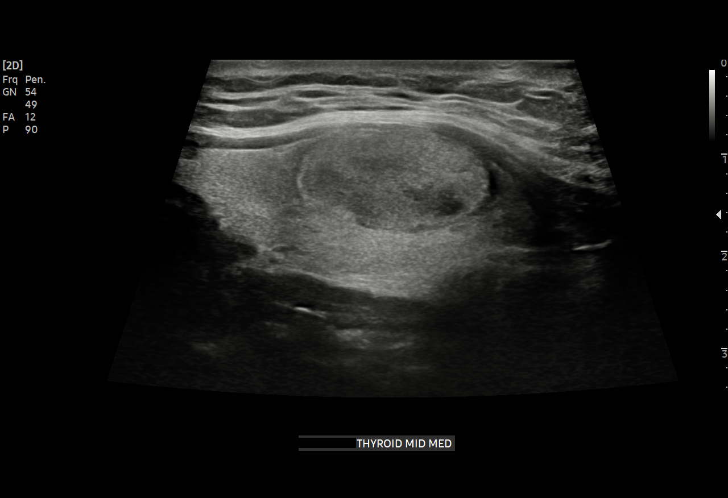
[im 4/11]
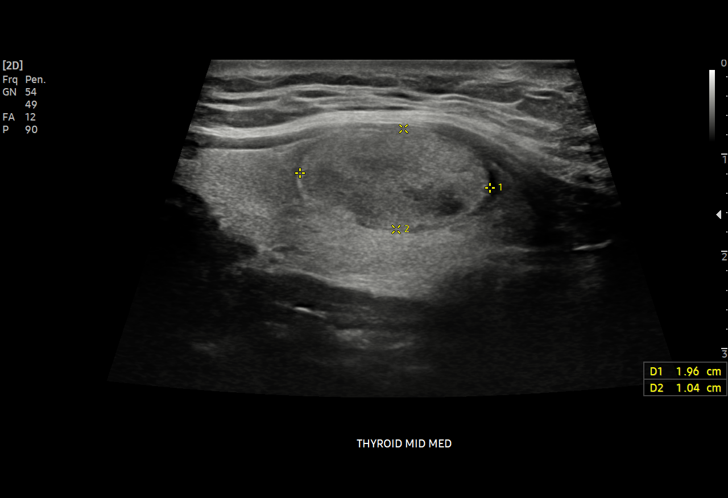
[im 5/11]
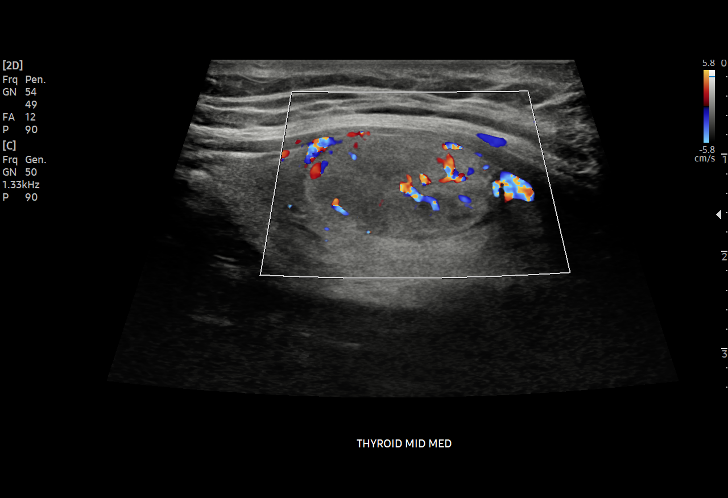
[im 6/11]
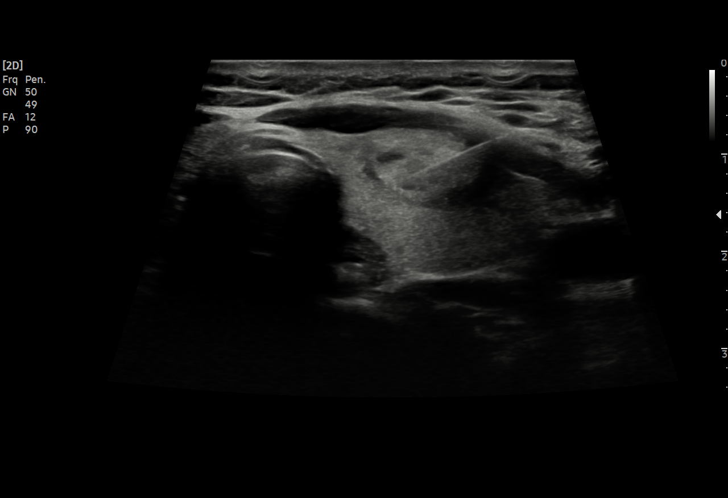
[im 7/11]
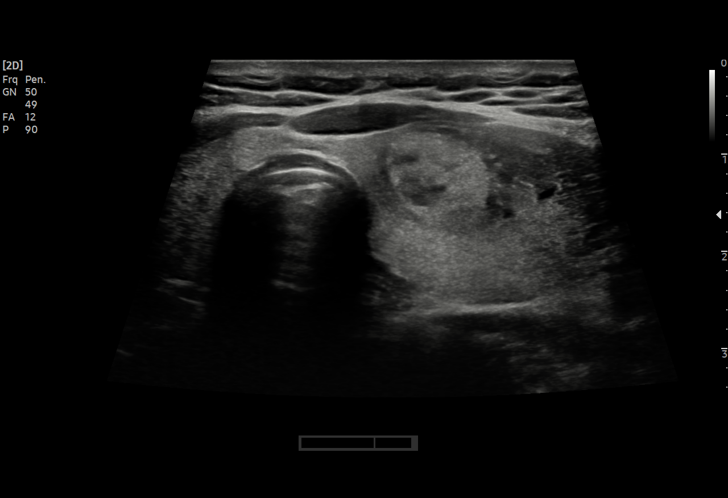
[im 8/11]
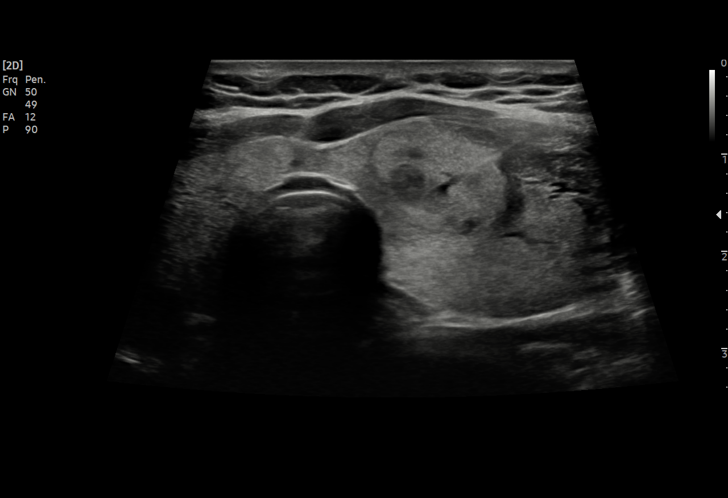
[im 9/11]
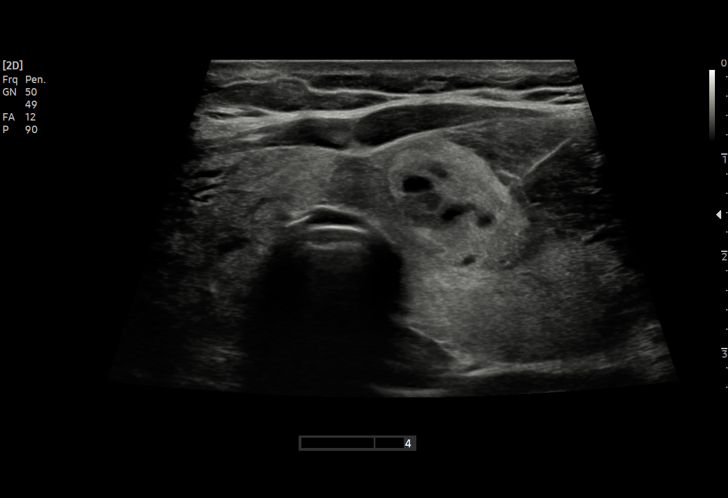
[im 10/11]
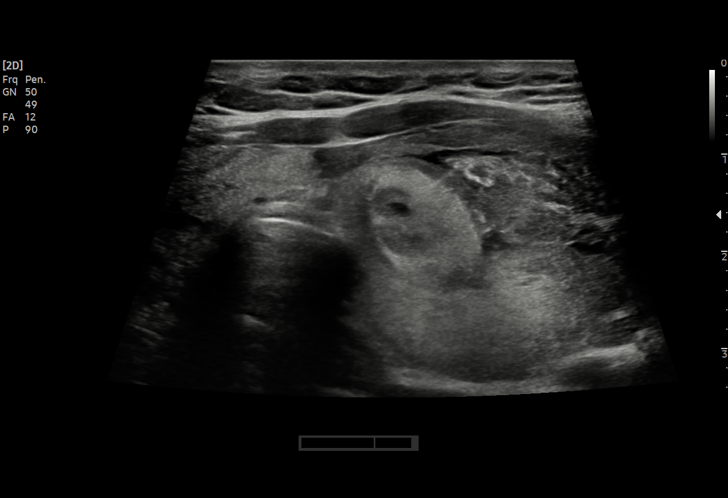
[im 11/11]
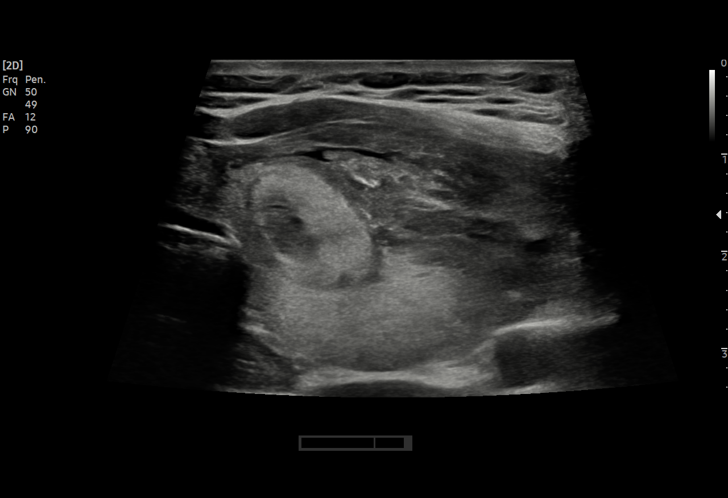

[11 of 11 positions shown; findings below may reference images not displayed]

Pre-procedural ultrasound scanning demonstrated unchanged size and
appearance of the indeterminate nodule within the left mid lobe

The procedure was planned. The neck was prepped in the usual sterile
fashion, and a sterile drape was applied covering the operative
field. A timeout was performed prior to the initiation of the
procedure. Local anesthesia was provided with 1% lidocaine.

Under direct ultrasound guidance, 5 FNA biopsies were performed of
the nodule within the left mid lobe with a 25 gauge needle.

Two samples were sent to [facility] per ordering [id number].

Multiple ultrasound images were saved for procedural documentation
purposes. The samples were prepared and submitted to pathology.

Limited post procedural scanning was negative for hematoma or
additional complication. Dressings were placed. The patient
tolerated the above procedures procedure well without immediate
postprocedural complication.
FINDINGS: Nodule reference number based on prior diagnostic ultrasound: 1

Maximum size: 2.1 cm

Location: Left; Mid

ACR TI-RADS risk category: TR4 (4-6 points)

Reason for biopsy: meets ACR TI-RADS criteria

Ultrasound imaging confirms appropriate placement of the needles
within the thyroid nodule.
IMPRESSION: Technically successful ultrasound guided fine needle aspiration of
nodule located within the left mid lobe of the thyroid

Read by: Jeremey Boudreaux, NP

## 2022-04-08 ENCOUNTER — Emergency Department: Payer: Managed Care, Other (non HMO)

## 2022-04-08 ENCOUNTER — Other Ambulatory Visit: Payer: Self-pay

## 2022-04-08 ENCOUNTER — Emergency Department
Admission: EM | Admit: 2022-04-08 | Discharge: 2022-04-08 | Disposition: A | Discharge status: Home / Self Care | Payer: Managed Care, Other (non HMO) | Attending: Emergency Medicine | Admitting: Emergency Medicine

## 2022-04-08 ENCOUNTER — Encounter: Payer: Self-pay | Admitting: Emergency Medicine

## 2022-04-08 DIAGNOSIS — I2510 Atherosclerotic heart disease of native coronary artery without angina pectoris: Secondary | ICD-10-CM | POA: Diagnosis not present

## 2022-04-08 DIAGNOSIS — R0789 Other chest pain: Secondary | ICD-10-CM | POA: Diagnosis present

## 2022-04-08 LAB — COMPREHENSIVE METABOLIC PANEL
ALT: 15 U/L (ref 0–44)
AST: 21 U/L (ref 15–41)
Albumin: 3.9 g/dL (ref 3.5–5.0)
Alkaline Phosphatase: 43 U/L (ref 38–126)
Anion gap: 7 (ref 5–15)
BUN: 14 mg/dL (ref 6–20)
CO2: 22 mmol/L (ref 22–32)
Calcium: 8.6 mg/dL — ABNORMAL LOW (ref 8.9–10.3)
Chloride: 111 mmol/L (ref 98–111)
Creatinine, Ser: 0.78 mg/dL (ref 0.44–1.00)
GFR, Estimated: >60 mL/min (ref >60)
Glucose, Bld: 90 mg/dL (ref 70–99)
Potassium: 3.9 mmol/L (ref 3.5–5.1)
Sodium: 140 mmol/L (ref 135–145)
Total Bilirubin: 0.5 mg/dL (ref 0.3–1.2)
Total Protein: 7.7 g/dL (ref 6.5–8.1)

## 2022-04-08 LAB — CBC
HCT: 39.8 % (ref 36.0–46.0)
Hemoglobin: 13.5 g/dL (ref 12.0–15.0)
MCH: 29.3 pg (ref 26.0–34.0)
MCHC: 33.9 g/dL (ref 30.0–36.0)
MCV: 86.3 fL (ref 80.0–100.0)
Platelets: 290 10*3/uL (ref 150–400)
RBC: 4.61 MIL/uL (ref 3.87–5.11)
RDW: 12.8 % (ref 11.5–15.5)
WBC: 4.8 10*3/uL (ref 4.0–10.5)
nRBC: 0 % (ref 0.0–0.2)

## 2022-04-08 LAB — TROPONIN I (HIGH SENSITIVITY): Troponin I (High Sensitivity): <2 ng/L (ref <18)

## 2022-04-08 LAB — LIPASE, BLOOD: Lipase: 44 U/L (ref 11–51)

## 2022-04-08 NOTE — ED Triage Notes (Signed)
Pt here with left arm numbness and tingling since Sat. Pt states she also has indigestion. Pt denies pain. Pt endorses diarrhea and some sweating. Pt states her arm is still numb.

## 2022-04-08 NOTE — ED Triage Notes (Signed)
First Nurse: Pt here from [facility] with left arm pain, indigestion, and some CP. Pt stable on arrival to triage.

## 2022-04-08 NOTE — ED Notes (Signed)
See triage note  Presents with "just not feeling well" since Saturday  Has had some indigestion/heartburn  But this am states she woke up with some numbness to left arm  Grips equal

## 2022-04-08 NOTE — ED Provider Notes (Signed)
[facility] Provider Note    Event Date/Time   First MD Initiated Contact with Patient 04/08/22 [phone]     (approximate)   History   Chest discomfort   HPI  Nicole Shelton is a 56 y.o. female with no history of CAD who presents with complaints of an unusual feeling in her left arm.  Patient reports she has not felt well over the last couple of days with intermittent episodes of acid reflux and chest discomfort.  This morning she woke up and had an unusual tingling feeling in her left arm which made her concerned that she may be having heart troubles.  She went to urgent care and was referred to the emergency department.  She is asymptomatic here and feels quite well.  No chest pain.  No neurodeficits     Physical Exam   Triage Vital Signs: ED Triage Vitals  Enc Vitals Group     BP 04/08/22 0817 (!) 143/105     Pulse Rate 04/08/22 0817 80     Resp 04/08/22 0817 18     Temp 04/08/22 0820 98.2 F (36.8 C)     Temp Source 04/08/22 0820 Oral     SpO2 04/08/22 0817 100 %     Weight 04/08/22 0817 81.7 kg (180 lb 1.9 oz)     Height 04/08/22 0817 1.702 m ('5\' 7"'$ )     Head Circumference --      Peak Flow --      Pain Score 04/08/22 0817 0     Pain Loc --      Pain Edu? --      Excl. in [place]? --     Most recent vital signs: Vitals:   04/08/22 0817 04/08/22 0820  BP: (!) 143/105   Pulse: 80   Resp: 18   Temp:  98.2 F (36.8 C)  SpO2: 100%      General: Awake, no distress.  CV:  Good peripheral perfusion.  Resp:  Normal effort.  Abd:  No distention.  Other:  Neuro exam is normal no weakness, no numbness, cranial nerves intact   ED Results / Procedures / Treatments   Labs (all labs ordered are listed, but only abnormal results are displayed) Labs Reviewed  COMPREHENSIVE METABOLIC PANEL - Abnormal; Notable for the following components:      Result Value   Calcium 8.6 (*)    All other components within normal limits  CBC  LIPASE, BLOOD   POC URINE PREG, ED  TROPONIN I (HIGH SENSITIVITY)     EKG  ED ECG REPORT I, Lavonia Drafts, the attending physician, personally viewed and interpreted this ECG.  Date: 04/08/2022  Rhythm: normal sinus rhythm QRS Axis: normal Intervals: normal ST/T Wave abnormalities: normal Narrative Interpretation: no evidence of acute ischemia    RADIOLOGY Chest x-ray viewed interpreted by me, no acute abnormality    PROCEDURES:  Critical Care performed:   Procedures   MEDICATIONS ORDERED IN ED: Medications - No data to display   IMPRESSION / MDM / [place] / ED COURSE  I reviewed the triage vital signs and the nursing notes. Patient's presentation is most consistent with acute presentation with potential threat to life or bodily function.  Patient overall well-appearing and in no acute distress.  Differential includes ACS, musculoskeletal radiculopathy, not consistent with CVA or TIA  High sensitive troponin and EKG are reassuring, lab work is unremarkable overall.  Chest x-ray is normal.  She is reassured,  she remains asymptomatic, appropriate for discharge with outpatient follow-up as needed, return precautions discussed.        FINAL CLINICAL IMPRESSION(S) / ED DIAGNOSES   Final diagnoses:  Chest discomfort     Rx / DC Orders   ED Discharge Orders     None        Note:  This document was prepared using Dragon voice recognition software and may include unintentional dictation errors.   Lavonia Drafts, MD 04/08/22 [phone]

## 2022-04-08 NOTE — ED Provider Triage Note (Signed)
Emergency Medicine Provider Triage Evaluation Note  Nicole Shelton , a 56 y.o. female  was evaluated in triage.  Pt complains of a lot of indigestion, burping and pain in the center of her chest, started last Wednesday.  Patient had COVID the week before Thanksgiving.  Now has numbness radiating into the left arm.  Can concerned when she had the numbness.  States just has not felt well for couple of days..  Review of Systems  Positive:  Negative:   Physical Exam  BP (!) 143/105 (BP Location: Right Arm)   Pulse 80   Temp 98.2 F (36.8 C) (Oral)   Resp 18   Ht '5\' 7"'$  (1.702 m)   Wt 81.7 kg   LMP 04/07/2022 (Exact Date)   SpO2 100%   BMI 28.21 kg/m  Gen:   Awake, no distress   Resp:  Normal effort  MSK:   Moves extremities without difficulty  Other:    Medical Decision Making  Medically screening exam initiated at 8:20 AM.  Appropriate orders placed.  Nicole Shelton was informed that the remainder of the evaluation will be completed by another provider, this initial triage assessment does not replace that evaluation, and the importance of remaining in the ED until their evaluation is complete.  Due to indigestion along with left arm pain will assess patient for MI, also do the indigestion we will add hepatic panel and lipase   Nicole Starks, PA-C 04/08/22 [phone]

## 2022-04-09 ENCOUNTER — Telehealth: Payer: Self-pay

## 2022-04-09 NOTE — Telephone Encounter (Signed)
Transition Care Management Follow-up Telephone Call Date of discharge and from where:[facility] 04-08-22 Dx: chest discomfort  How have you been since you were released from the hospital? Doing good  Any questions or concerns? No  Items Reviewed: Did the pt receive and understand the discharge instructions provided? Yes  Medications obtained and verified? Yes  Other? No  Any new allergies since your discharge? No  Dietary orders reviewed? Yes Do you have support at home? Yes   Home Care and Equipment/Supplies: Were home health services ordered? not applicable If so, what is the name of the agency? na  Has the agency set up a time to come to the patient's home? not applicable Were any new equipment or medical supplies ordered?  No What is the name of the medical supply agency? na Were you able to get the supplies/equipment? not applicable Do you have any questions related to the use of the equipment or supplies? No  Functional Questionnaire: (I = Independent and D = Dependent) ADLs: I  Bathing/Dressing- I  Meal Prep- I  Eating- I  Maintaining continence- I  Transferring/Ambulation- I  Managing Meds- I  Follow up appointments reviewed:  PCP Hospital f/u appt confirmed? No  .PT does not feel that she needs to come in for [facility] f/u appt confirmed? No  . Are transportation arrangements needed? No  If their condition worsens, is the pt aware to call PCP or go to the Emergency Dept.? Yes Was the patient provided with contact information for the PCP's office or ED? Yes Was to pt encouraged to call back with questions or concerns? Yes   Juanda Crumble LPN [place] [facility] Dial [phone]

## 2022-06-09 NOTE — Progress Notes (Signed)
57 y.o. G54P1011 Married Caucasian female here for annual exam.    Menses are irregular.  Has skipped a couple of months in 2023.   LMP 06/21/22.  Prior LMP was 06/02/22.   Some hot flashes and night around the time of her menstrual cycle.  Has a lot of pressure at the beginning of her cycle.  Dealing with weight gain.  Was successful with [employer] many years ago.  Her thyroid function is normal.  Status post partial thyroidectomy.   PCP:   Loura Pardon, MD  Patient's last menstrual period was 06/21/2022.     Period Pattern: (!) Irregular Menstrual Flow: Heavy Menstrual Control: Maxi pad Dysmenorrhea: (!) Moderate     Sexually active: Yes.    The current method of family planning is vasectomy.    Exercising: Yes.     walking Smoker:  no  Health Maintenance: Pap:  12/14/18 neg: HR HPV neg, 06-27-15 Neg:Neg HR HPV  History of abnormal Pap:  no MMG:  07/12/21 Breast Density Category C, BI-RADS CATEGORY 1 neg.  Has appointment.  Colonoscopy:  01/17/19 polyps removed, next 7 years.  BMD:   n/a  Result  n/a TDaP:  10/04/19 Gardasil:   no HIV: neg in preg Hep C: no Screening Labs:  PCP   reports that she has never smoked. She has never used smokeless tobacco. She reports that she does not drink alcohol and does not use drugs.  Past Medical History:  Diagnosis Date   Cancer ([place])    Gallstones 05/05/2012   GERD (gastroesophageal reflux disease)    History of chicken pox    History of recurrent UTIs    Hyperlipemia    IBS (irritable bowel syndrome)    Migraines    with aura.     Seasonal allergies    Thyroid disease 10/16/2020   thyroid nodule removed--cancer   Urinary incontinence    with laughing,coughing,sneezing    Past Surgical History:  Procedure Laterality Date   CHOLECYSTECTOMY  2014   COLONOSCOPY  01/03/2014   DILATION AND CURETTAGE OF UTERUS     MANDIBLE SURGERY  1985   POLYPECTOMY     THYROIDECTOMY Left 10/16/2020   Procedure: HEMI THYROIDECTOMY;   Surgeon: Carloyn Manner, MD;  Location: [facility] ORS;  Service: ENT;  Laterality: Left;    Current Outpatient Medications  Medication Sig Dispense Refill   acetaminophen (TYLENOL) 500 MG tablet Take 1,000 mg by mouth every 6 (six) hours as needed (for pain).     ascorbic acid (VITAMIN C) 500 MG tablet Take by mouth.     loratadine (CLARITIN) 10 MG tablet Take 10 mg by mouth daily.     magnesium oxide (MAG-OX) 400 MG tablet Take by mouth.     polyethylene glycol (MIRALAX / GLYCOLAX) 17 g packet Take 17 g by mouth 3 (three) times a week.     Sod Fluoride-Potassium Nitrate 1.1-5 % PSTE Place onto teeth.     Calcium-Magnesium-Vitamin D (CITRACAL SLOW RELEASE PO) Take 1 tablet by mouth in the morning. (Patient not taking: Reported on 06/23/2022)     No current facility-administered medications for this visit.    Family History  Problem Relation Age of Onset   Hyperlipidemia Mother        on medication recently   Irritable bowel syndrome Mother    Colon polyps Mother    Pulmonary fibrosis Father        double lung transplant   Heart disease Maternal Grandfather  Bladder Cancer Maternal Grandmother    Osteoarthritis Paternal Grandmother    Osteoarthritis Paternal Grandfather    Colon cancer Neg Hx    Esophageal cancer Neg Hx    Pancreatic cancer Neg Hx    Stomach cancer Neg Hx    Liver disease Neg Hx    Rectal cancer Neg Hx     Review of Systems  All other systems reviewed and are negative.   Exam:   BP 128/74 (BP Location: Right Arm, Patient Position: Sitting, Cuff Size: Normal)   Ht 5' 6.5" (1.689 m)   Wt 176 lb (79.8 kg)   LMP 06/21/2022   BMI 27.98 kg/m     General appearance: alert, cooperative and appears stated age Head: normocephalic, without obvious abnormality, atraumatic Neck: no adenopathy, supple, symmetrical, trachea midline and thyroid normal to inspection and palpation Lungs: clear to auscultation bilaterally Breasts: normal appearance, no masses or  tenderness, No nipple retraction or dimpling, No nipple discharge or bleeding, No axillary adenopathy Heart: regular rate and rhythm Abdomen: soft, non-tender; no masses, no organomegaly Extremities: extremities normal, atraumatic, no cyanosis or edema Skin: skin color, texture, turgor normal. No rashes or lesions Lymph nodes: cervical, supraclavicular, and axillary nodes normal. Neurologic: grossly normal  Pelvic: External genitalia:  no lesions              No abnormal inguinal nodes palpated.              Urethra:  normal appearing urethra with no masses, tenderness or lesions              Bartholins and Skenes: normal                 Vagina: normal appearing vagina with normal color and discharge, no lesions              Cervix: no lesions              Pap taken:  no Bimanual Exam:  Uterus:  normal size, contour, position, consistency, mobility, non-tender              Adnexa: no mass, fullness, tenderness              Rectal exam: yes.  Confirms.              Anus:  normal sphincter tone, no lesions  Chaperone was present for exam:  Emily  Assessment:   Well woman visit with gynecologic exam. Perimenopausal female.  Weight gain.  Hx migraine with aura.  Hx thyroid cancer.  Status post partial thyroidectomy.  Plan: Mammogram screening discussed. Self breast awareness reviewed. Pap and HR HPV next year.  Patient will call if she experiences continued irregular menses with cycles closer than 21 days apart.  She understands that this may need evaluation if persists. Guidelines for Calcium, Vitamin D, regular exercise program including cardiovascular and weight bearing exercise. We discussed portion control and healthy food choices along with vigorous exercise.  Follow up annually and prn.   After visit summary provided.

## 2022-06-23 ENCOUNTER — Ambulatory Visit (INDEPENDENT_AMBULATORY_CARE_PROVIDER_SITE_OTHER): Payer: Managed Care, Other (non HMO) | Admitting: Obstetrics and Gynecology

## 2022-06-23 ENCOUNTER — Encounter: Payer: Self-pay | Admitting: Obstetrics and Gynecology

## 2022-06-23 VITALS — BP 128/74 | Ht 66.5 in | Wt 176.0 lb

## 2022-06-23 DIAGNOSIS — Z01419 Encounter for gynecological examination (general) (routine) without abnormal findings: Secondary | ICD-10-CM | POA: Diagnosis not present

## 2022-06-23 NOTE — Patient Instructions (Signed)

## 2022-07-17 ENCOUNTER — Encounter: Payer: Self-pay | Admitting: Obstetrics and Gynecology

## 2022-07-22 ENCOUNTER — Encounter: Payer: Self-pay | Admitting: Obstetrics and Gynecology

## 2022-10-06 ENCOUNTER — Telehealth: Payer: Self-pay | Admitting: Family Medicine

## 2022-10-06 DIAGNOSIS — Z0000 Encounter for general adult medical examination without abnormal findings: Secondary | ICD-10-CM

## 2022-10-06 DIAGNOSIS — E7800 Pure hypercholesterolemia, unspecified: Secondary | ICD-10-CM

## 2022-10-06 NOTE — Telephone Encounter (Signed)
-----   Message from Alvina Chou sent at 09/22/2022  2:38 PM EDT ----- Regarding: lab orders for Tuesday, 6.4.24 Patient is scheduled for CPX labs, please order future labs, Thanks , Camelia Eng

## 2022-10-07 ENCOUNTER — Other Ambulatory Visit (INDEPENDENT_AMBULATORY_CARE_PROVIDER_SITE_OTHER): Payer: Managed Care, Other (non HMO)

## 2022-10-07 DIAGNOSIS — E7800 Pure hypercholesterolemia, unspecified: Secondary | ICD-10-CM | POA: Diagnosis not present

## 2022-10-07 DIAGNOSIS — Z0000 Encounter for general adult medical examination without abnormal findings: Secondary | ICD-10-CM

## 2022-10-07 LAB — LIPID PANEL
Cholesterol: 206 mg/dL — ABNORMAL HIGH (ref 0–200)
HDL: 35.5 mg/dL — ABNORMAL LOW (ref >39.00)
LDL Cholesterol: 144 mg/dL — ABNORMAL HIGH (ref 0–99)
NonHDL: 170.56
Total CHOL/HDL Ratio: 6
Triglycerides: 134 mg/dL (ref 0.0–149.0)
VLDL: 26.8 mg/dL (ref 0.0–40.0)

## 2022-10-07 LAB — CBC WITH DIFFERENTIAL/PLATELET
Basophils Absolute: 0 10*3/uL (ref 0.0–0.1)
Basophils Relative: 0.6 % (ref 0.0–3.0)
Eosinophils Absolute: 0 10*3/uL (ref 0.0–0.7)
Eosinophils Relative: 0.4 % (ref 0.0–5.0)
HCT: 40.3 % (ref 36.0–46.0)
Hemoglobin: 13.8 g/dL (ref 12.0–15.0)
Lymphocytes Relative: 28.1 % (ref 12.0–46.0)
Lymphs Abs: 0.8 10*3/uL (ref 0.7–4.0)
MCHC: 34.1 g/dL (ref 30.0–36.0)
MCV: 85.6 fl (ref 78.0–100.0)
Monocytes Absolute: 0.3 10*3/uL (ref 0.1–1.0)
Monocytes Relative: 11.2 % (ref 3.0–12.0)
Neutro Abs: 1.7 10*3/uL (ref 1.4–7.7)
Neutrophils Relative %: 59.7 % (ref 43.0–77.0)
Platelets: 292 10*3/uL (ref 150.0–400.0)
RBC: 4.71 Mil/uL (ref 3.87–5.11)
RDW: 13.7 % (ref 11.5–15.5)
WBC: 2.8 10*3/uL — ABNORMAL LOW (ref 4.0–10.5)

## 2022-10-07 LAB — COMPREHENSIVE METABOLIC PANEL
ALT: 14 U/L (ref 0–35)
AST: 18 U/L (ref 0–37)
Albumin: 4 g/dL (ref 3.5–5.2)
Alkaline Phosphatase: 50 U/L (ref 39–117)
BUN: 11 mg/dL (ref 6–23)
CO2: 24 mEq/L (ref 19–32)
Calcium: 8.6 mg/dL (ref 8.4–10.5)
Chloride: 109 mEq/L (ref 96–112)
Creatinine, Ser: 0.77 mg/dL (ref 0.40–1.20)
GFR: 87.36 mL/min (ref >60.00)
Glucose, Bld: 85 mg/dL (ref 70–99)
Potassium: 3.8 mEq/L (ref 3.5–5.1)
Sodium: 137 mEq/L (ref 135–145)
Total Bilirubin: 0.5 mg/dL (ref 0.2–1.2)
Total Protein: 7.2 g/dL (ref 6.0–8.3)

## 2022-10-07 LAB — TSH: TSH: 2.31 u[IU]/mL (ref 0.35–5.50)

## 2022-10-14 ENCOUNTER — Encounter: Payer: Self-pay | Admitting: Family Medicine

## 2022-10-14 ENCOUNTER — Ambulatory Visit (INDEPENDENT_AMBULATORY_CARE_PROVIDER_SITE_OTHER): Payer: Managed Care, Other (non HMO) | Admitting: Family Medicine

## 2022-10-14 VITALS — BP 124/86 | HR 64 | Temp 97.8°F | Ht 66.5 in | Wt 164.5 lb

## 2022-10-14 DIAGNOSIS — E7800 Pure hypercholesterolemia, unspecified: Secondary | ICD-10-CM

## 2022-10-14 DIAGNOSIS — D126 Benign neoplasm of colon, unspecified: Secondary | ICD-10-CM | POA: Diagnosis not present

## 2022-10-14 DIAGNOSIS — D72819 Decreased white blood cell count, unspecified: Secondary | ICD-10-CM | POA: Diagnosis not present

## 2022-10-14 DIAGNOSIS — Z0000 Encounter for general adult medical examination without abnormal findings: Secondary | ICD-10-CM

## 2022-10-14 NOTE — Assessment & Plan Note (Signed)
Reviewed health habits including diet and exercise and skin cancer prevention Reviewed appropriate screening tests for age  Also reviewed health mt list, fam hx and immunization status , as well as social and family history   See HPI Labs reviewed and ordered Mammogram 07/2022 Colonoscopy 2020 with 7 y recall Utd gyn care and pap  Encouraged ca and D Encouraged strength training  PHQ is 0 today Discussed lifestyle change for high cholesterol Discussed skin care and sun protection

## 2022-10-14 NOTE — Assessment & Plan Note (Signed)
Colonoscopy 2020 with 7 y recall

## 2022-10-14 NOTE — Assessment & Plan Note (Signed)
Disc goals for lipids and reasons to control them Rev last labs with pt Rev low sat fat diet in detail Stable   Given handouts re: chol and diet  Continue to watch 10 y risk is fairly low

## 2022-10-14 NOTE — Progress Notes (Signed)
Subjective:    Patient ID: Nicole Shelton, female    DOB: [birth year], 57 y.o.   MRN: [id number]  HPI  Here for health maintenance exam and to review chronic medical problems  Son is here to assist and translate   Wt Readings from Last 3 Encounters:  10/14/22 164 lb 8 oz (74.6 kg)  06/23/22 176 lb (79.8 kg)  04/08/22 180 lb 1.9 oz (81.7 kg)   26.15 kg/m  Vitals:   10/14/22 0822  BP: 124/86  Pulse: 64  Temp: 97.8 F (36.6 C)  SpO2: 99%   Feeling fine   Trying to take care of herself  Trying to loose weight with weight watchers program (came back to it)  Needs more exercise Does some walking    Immunization History  Administered Date(s) Administered   Influenza Inj Mdck Quad Pf 02/03/2017   Influenza, Seasonal, Injecte, Preservative Fre 05/09/2015   Influenza,inj,Quad PF,6+ Mos 02/12/2018   Influenza-Unspecified 03/06/2014, 02/03/2017, 01/28/2018   PFIZER Comirnaty(Gray Top)Covid-19 Tri-Sucrose Vaccine 01/20/2020   PFIZER(Purple Top)SARS-COV-2 Vaccination 12/01/2019   Tdap 10/08/2011, 10/04/2019   Zoster Recombinat (Shingrix) 10/04/2019, 01/20/2020    Health Maintenance Due  Topic Date Due   Hepatitis C Screening  Never done   Mammogram 07/2022 Self breast exam: no lumps   Gyn  Had gyn exam in 06/2022    Pap 12/2018 normal with neg HPV-noted pap will be due per gyn next year   Colonoscopy 01/2019 with 7 y recall  Bone health  Falls- none Fractures-none  Supplements - ca and D /not regular  Exercise -walking    Mood    10/10/2021    9:57 AM 10/02/2020    9:56 AM 10/02/2020    8:11 AM 09/26/2019    8:55 AM 09/23/2018    7:54 PM  Depression screen PHQ 2/9  Decreased Interest 0 0 0 0 0  Down, Depressed, Hopeless 0 0 0 0 0  PHQ - 2 Score 0 0 0 0 0  Altered sleeping    0   Tired, decreased energy    0   Change in appetite    0   Feeling bad or failure about yourself     0   Trouble concentrating    0   Moving slowly or fidgety/restless    0    Suicidal thoughts    0   PHQ-9 Score    0   Difficult doing work/chores    Not difficult at all    Hyperlipidemia Lab Results  Component Value Date   CHOL 206 (H) 10/07/2022   CHOL 219 (H) 10/02/2021   CHOL 208 (H) 09/19/2020   Lab Results  Component Value Date   HDL 35.50 (L) 10/07/2022   HDL 45.50 10/02/2021   HDL 37.30 (L) 09/19/2020   Lab Results  Component Value Date   LDLCALC 144 (H) 10/07/2022   LDLCALC 145 (H) 10/02/2021   LDLCALC 136 (H) 09/19/2019   Lab Results  Component Value Date   TRIG 134.0 10/07/2022   TRIG 144.0 10/02/2021   TRIG 229.0 (H) 09/19/2020   Lab Results  Component Value Date   CHOLHDL 6 10/07/2022   CHOLHDL 5 10/02/2021   CHOLHDL 6 09/19/2020   Lab Results  Component Value Date   LDLDIRECT 124.0 09/19/2020   LDLDIRECT 139.5 10/06/2011   LDLDIRECT 149.0 11/27/2008   Since she has been on weight watchers-eating much better  Avoids high fat meats and butter Cut back on cheese  No  breakfast meat  Red meat-once per week   Cholesterol stable   The 10-year ASCVD risk score (Arnett DK, et al., 2019) is: 2.7%   Values used to calculate the score:     Age: 62 years     Sex: Female     Is Non-Hispanic African American: No     Diabetic: No     Tobacco smoker: No     Systolic Blood Pressure: 124 mmHg     Is BP treated: No     HDL Cholesterol: 35.5 mg/dL     Total Cholesterol: 206 mg/dL   MGF CAD Mother with high cholesterol     Past h/o thyroid cancer    Other labs CMP     Component Value Date/Time   NA 137 10/07/2022 0743   K 3.8 10/07/2022 0743   CL 109 10/07/2022 0743   CO2 24 10/07/2022 0743   GLUCOSE 85 10/07/2022 0743   BUN 11 10/07/2022 0743   CREATININE 0.77 10/07/2022 0743   CREATININE 0.85 06/27/2015 0938   CALCIUM 8.6 10/07/2022 0743   PROT 7.2 10/07/2022 0743   ALBUMIN 4.0 10/07/2022 0743   AST 18 10/07/2022 0743   ALT 14 10/07/2022 0743   ALKPHOS 50 10/07/2022 0743   BILITOT 0.5 10/07/2022 0743    GFR 87.36 10/07/2022 0743   GFRNONAA >60 04/08/2022 0826   Lab Results  Component Value Date   WBC 2.8 (L) 10/07/2022   HGB 13.8 10/07/2022   HCT 40.3 10/07/2022   MCV 85.6 10/07/2022   PLT 292.0 10/07/2022   Wbc lower than usual  Diff was normal  No recent cold or viral illness  Feeling ok      Lab Results  Component Value Date   TSH 2.31 10/07/2022    Patient Active Problem List   Diagnosis Date Noted   History of thyroid cancer 10/10/2021   Frequent headaches 10/02/2020   Thyroid nodule 10/02/2020   Gastroesophageal reflux disease 04/25/2020   Leukopenia 08/31/2015   Adenomatous colon polyp 03/02/2014   Routine general medical examination at a health care facility 10/06/2011   Hyperlipidemia 04/11/2008   IRRITABLE BOWEL SYNDROME 04/11/2008   Past Medical History:  Diagnosis Date   Allergy    Seasonal allergies   Cancer (HCC)    Gallstones 05/05/2012   GERD (gastroesophageal reflux disease)    History of chicken pox    History of recurrent UTIs    Hyperlipemia    IBS (irritable bowel syndrome)    Migraines    with aura.     Seasonal allergies    Thyroid disease 10/16/2020   thyroid nodule removed--cancer   Urinary incontinence    with laughing,coughing,sneezing   Past Surgical History:  Procedure Laterality Date   CHOLECYSTECTOMY  2014   COLONOSCOPY  01/03/2014   COSMETIC SURGERY  1985   jaw surgery   DILATION AND CURETTAGE OF UTERUS     MANDIBLE SURGERY  1985   POLYPECTOMY     THYROIDECTOMY Left 10/16/2020   Procedure: HEMI THYROIDECTOMY;  Surgeon: Bud Face, MD;  Location: [facility];  Service: ENT;  Laterality: Left;   Social History   Tobacco Use   Smoking status: Never   Smokeless tobacco: Never  Vaping Use   Vaping Use: Never used  Substance Use Topics   Alcohol use: No   Drug use: Never   Family History  Problem Relation Age of Onset   Hyperlipidemia Mother        on medication recently  Irritable bowel syndrome Mother     Colon polyps Mother    Pulmonary fibrosis Father        double lung transplant   Cancer Father    Heart disease Maternal Grandfather    Bladder Cancer Maternal Grandmother    Osteoarthritis Paternal Grandmother    Arthritis Paternal Grandmother    Osteoarthritis Paternal Grandfather    Colon cancer Neg Hx    Esophageal cancer Neg Hx    Pancreatic cancer Neg Hx    Stomach cancer Neg Hx    Liver disease Neg Hx    Rectal cancer Neg Hx    Allergies  Allergen Reactions   Amoxicillin Other (See Comments)    back, leg pain   Cetirizine Hcl Other (See Comments)    sedation   Current Outpatient Medications on File Prior to Visit  Medication Sig Dispense Refill   Calcium-Magnesium-Vitamin D (CITRACAL SLOW RELEASE PO) Take 1 tablet by mouth in the morning.     loratadine (CLARITIN) 10 MG tablet Take 10 mg by mouth daily.     Sod Fluoride-Potassium Nitrate 1.1-5 % PSTE Place onto teeth.     No current facility-administered medications on file prior to visit.      Review of Systems  Constitutional:  Negative for activity change, appetite change, fatigue, fever and unexpected weight change.  HENT:  Negative for congestion, ear pain, rhinorrhea, sinus pressure and sore throat.   Eyes:  Negative for pain, redness and visual disturbance.  Respiratory:  Negative for cough, shortness of breath and wheezing.   Cardiovascular:  Negative for chest pain and palpitations.  Gastrointestinal:  Negative for abdominal pain, blood in stool, constipation and diarrhea.  Endocrine: Negative for polydipsia and polyuria.  Genitourinary:  Negative for dysuria, frequency and urgency.  Musculoskeletal:  Negative for arthralgias, back pain and myalgias.  Skin:  Negative for pallor and rash.  Allergic/Immunologic: Negative for environmental allergies.  Neurological:  Negative for dizziness, syncope and headaches.  Hematological:  Negative for adenopathy. Does not bruise/bleed easily.   Psychiatric/Behavioral:  Negative for decreased concentration and dysphoric mood. The patient is not nervous/anxious.        Objective:   Physical Exam Constitutional:      General: She is not in acute distress.    Appearance: Normal appearance. She is well-developed and normal weight. She is not ill-appearing or diaphoretic.  HENT:     Head: Normocephalic and atraumatic.     Right Ear: Tympanic membrane, ear canal and external ear normal.     Left Ear: Tympanic membrane, ear canal and external ear normal.     Nose: Nose normal. No congestion.     Mouth/Throat:     Mouth: Mucous membranes are moist.     Pharynx: Oropharynx is clear. No posterior oropharyngeal erythema.  Eyes:     General: No scleral icterus.    Extraocular Movements: Extraocular movements intact.     Conjunctiva/sclera: Conjunctivae normal.     Pupils: Pupils are equal, round, and reactive to light.  Neck:     Thyroid: No thyromegaly.     Vascular: No carotid bruit or JVD.  Cardiovascular:     Rate and Rhythm: Normal rate and regular rhythm.     Pulses: Normal pulses.     Heart sounds: Normal heart sounds.     No gallop.  Pulmonary:     Effort: Pulmonary effort is normal. No respiratory distress.     Breath sounds: Normal breath sounds. No wheezing.  Comments: Good air exch Chest:     Chest wall: No tenderness.  Abdominal:     General: Bowel sounds are normal. There is no distension or abdominal bruit.     Palpations: Abdomen is soft. There is no mass.     Tenderness: There is no abdominal tenderness.     Hernia: No hernia is present.  Genitourinary:    Comments: Breast and pelvic exam are done by gyn provider    Musculoskeletal:        General: No tenderness. Normal range of motion.     Cervical back: Normal range of motion and neck supple. No rigidity. No muscular tenderness.     Right lower leg: No edema.     Left lower leg: No edema.     Comments: No kyphosis   Lymphadenopathy:      Cervical: No cervical adenopathy.  Skin:    General: Skin is warm and dry.     Coloration: Skin is not pale.     Findings: No erythema or rash.     Comments: Solar lentigines diffusely   Neurological:     Mental Status: She is alert. Mental status is at baseline.     Cranial Nerves: No cranial nerve deficit.     Motor: No abnormal muscle tone.     Coordination: Coordination normal.     Gait: Gait normal.     Deep Tendon Reflexes: Reflexes are normal and symmetric. Reflexes normal.  Psychiatric:        Mood and Affect: Mood normal.        Cognition and Memory: Cognition and memory normal.           Assessment & Plan:   Problem List Items Addressed This Visit       Digestive   Adenomatous colon polyp    Colonoscopy 2020 with 7 y recall        Other   Routine general medical examination at a health care facility - Primary    Reviewed health habits including diet and exercise and skin cancer prevention Reviewed appropriate screening tests for age  Also reviewed health mt list, fam hx and immunization status , as well as social and family history   See HPI Labs reviewed and ordered Mammogram 07/2022 Colonoscopy 2020 with 7 y recall Utd gyn care and pap  Encouraged ca and D Encouraged strength training  PHQ is 0 today Discussed lifestyle change for high cholesterol Discussed skin care and sun protection      Leukopenia    Wbc 2.8 Has been mildly low in past No recent illness or symptoms [facility] re check 1 mo with path review       Relevant Orders   CBC with Differential/Platelet   Pathologist smear review   Hyperlipidemia    Disc goals for lipids and reasons to control them Rev last labs with pt Rev low sat fat diet in detail Stable   Given handouts re: chol and diet  Continue to watch 10 y risk is fairly low

## 2022-10-14 NOTE — Assessment & Plan Note (Signed)
Wbc 2.8 Has been mildly low in past No recent illness or symptoms [facility] re check 1 mo with path review

## 2022-10-14 NOTE — Patient Instructions (Addendum)
Add some strength training to your routine, this is important for bone and brain health and can reduce your risk of falls and help your body use insulin properly and regulate weight  Light weights, exercise bands , and internet videos are a good way to start  Yoga (chair or regular), machines , floor exercises or a gym with machines are also good options     Try to get 1200-1500 mg of calcium per day with at least 2000 iu of vitamin D - for bone health If calcium constipates you then just take the D  Use a pill box   For cholesterol Avoid red meat/ fried foods/ egg yolks/ fatty breakfast meats/ butter, cheese and high fat dairy/ and shellfish   Consider cutting red meat to once per month   I need to re check your white blood cell count in about a month

## 2022-11-13 ENCOUNTER — Other Ambulatory Visit: Payer: Managed Care, Other (non HMO)

## 2022-11-13 DIAGNOSIS — D72819 Decreased white blood cell count, unspecified: Secondary | ICD-10-CM

## 2022-11-13 NOTE — Addendum Note (Signed)
Addended by: Alvina Chou on: 11/13/2022 07:26 AM   Modules accepted: Orders

## 2022-11-14 LAB — CBC WITH DIFFERENTIAL/PLATELET
Absolute Monocytes: 406 cells/uL (ref 200–950)
Basophils Absolute: 20 cells/uL (ref 0–200)
Basophils Relative: 0.6 %
Eosinophils Absolute: 0 cells/uL — ABNORMAL LOW (ref 15–500)
Eosinophils Relative: 0 %
HCT: 42.1 % (ref 35.0–45.0)
Hemoglobin: 14.3 g/dL (ref 11.7–15.5)
Lymphs Abs: 997 cells/uL (ref 850–3900)
MCH: 29.6 pg (ref 27.0–33.0)
MCHC: 34 g/dL (ref 32.0–36.0)
MCV: 87.2 fL (ref 80.0–100.0)
MPV: 9.9 fL (ref 7.5–12.5)
Monocytes Relative: 12.3 %
Neutro Abs: 1878 cells/uL (ref 1500–7800)
Neutrophils Relative %: 56.9 %
Platelets: 289 10*3/uL (ref 140–400)
RBC: 4.83 10*6/uL (ref 3.80–5.10)
RDW: 12.8 % (ref 11.0–15.0)
Total Lymphocyte: 30.2 %
WBC: 3.3 10*3/uL — ABNORMAL LOW (ref 3.8–10.8)

## 2022-11-14 LAB — PATHOLOGIST SMEAR REVIEW

## 2023-01-27 ENCOUNTER — Ambulatory Visit (INDEPENDENT_AMBULATORY_CARE_PROVIDER_SITE_OTHER)
Admission: RE | Admit: 2023-01-27 | Discharge: 2023-01-27 | Disposition: A | Discharge status: Home / Self Care | Payer: Managed Care, Other (non HMO) | Source: Ambulatory Visit | Attending: Family Medicine | Admitting: Family Medicine

## 2023-01-27 ENCOUNTER — Ambulatory Visit: Payer: Managed Care, Other (non HMO) | Admitting: Family Medicine

## 2023-01-27 VITALS — BP 124/68 | HR 70 | Temp 97.8°F | Ht 66.5 in | Wt 146.4 lb

## 2023-01-27 DIAGNOSIS — M542 Cervicalgia: Secondary | ICD-10-CM

## 2023-01-27 DIAGNOSIS — R42 Dizziness and giddiness: Secondary | ICD-10-CM

## 2023-01-27 LAB — BASIC METABOLIC PANEL
BUN: 17 mg/dL (ref 6–23)
CO2: 30 mEq/L (ref 19–32)
Calcium: 9.3 mg/dL (ref 8.4–10.5)
Chloride: 105 mEq/L (ref 96–112)
Creatinine, Ser: 0.73 mg/dL (ref 0.40–1.20)
GFR: 92.94 mL/min (ref >60.00)
Glucose, Bld: 79 mg/dL (ref 70–99)
Potassium: 4.6 mEq/L (ref 3.5–5.1)
Sodium: 141 mEq/L (ref 135–145)

## 2023-01-27 LAB — CBC WITH DIFFERENTIAL/PLATELET
Basophils Absolute: 0 10*3/uL (ref 0.0–0.1)
Basophils Relative: 0.5 % (ref 0.0–3.0)
Eosinophils Absolute: 0 10*3/uL (ref 0.0–0.7)
Eosinophils Relative: 0.2 % (ref 0.0–5.0)
HCT: 40.1 % (ref 36.0–46.0)
Hemoglobin: 13.4 g/dL (ref 12.0–15.0)
Lymphocytes Relative: 13.8 % (ref 12.0–46.0)
Lymphs Abs: 0.8 10*3/uL (ref 0.7–4.0)
MCHC: 33.5 g/dL (ref 30.0–36.0)
MCV: 86.4 fl (ref 78.0–100.0)
Monocytes Absolute: 0.4 10*3/uL (ref 0.1–1.0)
Monocytes Relative: 7.6 % (ref 3.0–12.0)
Neutro Abs: 4.3 10*3/uL (ref 1.4–7.7)
Neutrophils Relative %: 77.9 % — ABNORMAL HIGH (ref 43.0–77.0)
Platelets: 309 10*3/uL (ref 150.0–400.0)
RBC: 4.64 Mil/uL (ref 3.87–5.11)
RDW: 13.7 % (ref 11.5–15.5)
WBC: 5.5 10*3/uL (ref 4.0–10.5)

## 2023-01-27 LAB — TSH: TSH: 1.62 u[IU]/mL (ref 0.35–5.50)

## 2023-01-27 NOTE — Assessment & Plan Note (Signed)
Left sided today  Acute on chronic-changes sides Story is consistent with muscle spasm (brief)  Normal exam and rom (some pain with rotation to right) Encouraged to make sure pillow fits her well  Trial of heat/ice  Given handout of neck exercises/ strain/ rehab to try  Update if not starting to improve in a week or if worsening  Call back and Er precautions noted in detail today

## 2023-01-27 NOTE — Progress Notes (Signed)
Subjective:    Patient ID: Nicole Shelton, female    DOB: [birth year], 57 y.o.   MRN: [id number]  HPI  Wt Readings from Last 3 Encounters:  01/27/23 146 lb 6 oz (66.4 kg)  10/14/22 164 lb 8 oz (74.6 kg)  06/23/22 176 lb (79.8 kg)   23.27 kg/m  Vitals:   01/27/23 1134  BP: 124/68  Pulse: 70  Temp: 97.8 F (36.6 C)  SpO2: 100%   Pt presents with  Light headedness  Neck pain   Light headedness  When she bends over and stands up quickly - light headed (fleeting)  Is drinking lots of fluids/ water  No spinning   No n/v No syncope or LOC   No headaches    Neck pain  Acute on chronic  Mainly on left side this time Sharp pain - can be severe at times / has to hold her neck and wait for it to subside  5 minutes at most  Not in bones  Does radiate to back of head  Does not radiate to either arm    Triggered by turning head  Random/no particular time of day   No meds Has not tried ice or heat    Does not wake up with stiff neck Is happy with her pillow     Has fall allergies in season   No speech problems No facial droop No n/t or weakness   Stress level is about the same as usual  Son just got married -happy  Work is always crazy   No period since may   No recent surgery   Lab Results  Component Value Date   TSH 2.31 10/07/2022   Has 1/2 of a thyroid   Has had intentional weight loss with weight watchers - 30 lb     Patient Active Problem List   Diagnosis Date Noted   Light headedness 01/27/2023   Neck pain on left side 01/27/2023   History of thyroid cancer 10/10/2021   Frequent headaches 10/02/2020   Thyroid nodule 10/02/2020   Gastroesophageal reflux disease 04/25/2020   Leukopenia 08/31/2015   Adenomatous colon polyp 03/02/2014   Routine general medical examination at a health care facility 10/06/2011   Hyperlipidemia 04/11/2008   IRRITABLE BOWEL SYNDROME 04/11/2008   Past Medical History:  Diagnosis Date   Allergy     Seasonal allergies   Cancer (HCC)    Gallstones 05/05/2012   GERD (gastroesophageal reflux disease)    History of chicken pox    History of recurrent UTIs    Hyperlipemia    IBS (irritable bowel syndrome)    Migraines    with aura.     Seasonal allergies    Thyroid disease 10/16/2020   thyroid nodule removed--cancer   Urinary incontinence    with laughing,coughing,sneezing   Past Surgical History:  Procedure Laterality Date   CHOLECYSTECTOMY  2014   COLONOSCOPY  01/03/2014   COSMETIC SURGERY  1985   jaw surgery   DILATION AND CURETTAGE OF UTERUS     MANDIBLE SURGERY  1985   POLYPECTOMY     THYROIDECTOMY Left 10/16/2020   Procedure: HEMI THYROIDECTOMY;  Surgeon: Bud Face, MD;  Location: [facility];  Service: ENT;  Laterality: Left;   Social History   Tobacco Use   Smoking status: Never   Smokeless tobacco: Never  Vaping Use   Vaping status: Never Used  Substance Use Topics   Alcohol use: No   Drug use: Never  Family History  Problem Relation Age of Onset   Hyperlipidemia Mother        on medication recently   Irritable bowel syndrome Mother    Colon polyps Mother    Pulmonary fibrosis Father        double lung transplant   Cancer Father    Heart disease Maternal Grandfather    Bladder Cancer Maternal Grandmother    Osteoarthritis Paternal Grandmother    Arthritis Paternal Grandmother    Osteoarthritis Paternal Grandfather    Colon cancer Neg Hx    Esophageal cancer Neg Hx    Pancreatic cancer Neg Hx    Stomach cancer Neg Hx    Liver disease Neg Hx    Rectal cancer Neg Hx    Allergies  Allergen Reactions   Amoxicillin Other (See Comments)    back, leg pain   Cetirizine Hcl Other (See Comments)    sedation   Current Outpatient Medications on File Prior to Visit  Medication Sig Dispense Refill   Calcium-Magnesium-Vitamin D (CITRACAL SLOW RELEASE PO) Take 1 tablet by mouth in the morning.     loratadine (CLARITIN) 10 MG tablet Take 10 mg by  mouth daily.     Sod Fluoride-Potassium Nitrate 1.1-5 % PSTE Place onto teeth.     No current facility-administered medications on file prior to visit.    Review of Systems  Constitutional:  Negative for activity change, appetite change, fatigue, fever and unexpected weight change.  HENT:  Negative for congestion, ear pain, rhinorrhea, sinus pressure and sore throat.   Eyes:  Negative for pain, redness and visual disturbance.  Respiratory:  Negative for cough, shortness of breath and wheezing.   Cardiovascular:  Negative for chest pain and palpitations.  Gastrointestinal:  Negative for abdominal pain, blood in stool, constipation and diarrhea.  Endocrine: Negative for polydipsia and polyuria.  Genitourinary:  Negative for dysuria, frequency and urgency.  Musculoskeletal:  Positive for neck pain and neck stiffness. Negative for arthralgias, back pain and myalgias.  Skin:  Negative for pallor and rash.  Allergic/Immunologic: Negative for environmental allergies.  Neurological:  Positive for light-headedness. Negative for dizziness, tremors, seizures, syncope, facial asymmetry, speech difficulty, weakness, numbness and headaches.  Hematological:  Negative for adenopathy. Does not bruise/bleed easily.  Psychiatric/Behavioral:  Negative for decreased concentration and dysphoric mood. The patient is not nervous/anxious.        Objective:   Physical Exam Constitutional:      General: She is not in acute distress.    Appearance: Normal appearance. She is well-developed and normal weight. She is not ill-appearing or diaphoretic.  HENT:     Head: Normocephalic and atraumatic.     Right Ear: Tympanic membrane, ear canal and external ear normal.     Left Ear: Tympanic membrane, ear canal and external ear normal.     Nose: Nose normal.     Mouth/Throat:     Mouth: Mucous membranes are moist.     Pharynx: No oropharyngeal exudate.  Eyes:     General: No scleral icterus.       Right eye: No  discharge.        Left eye: No discharge.     Conjunctiva/sclera: Conjunctivae normal.     Pupils: Pupils are equal, round, and reactive to light.     Comments: No nystagmus  Neck:     Thyroid: No thyromegaly.     Vascular: No carotid bruit or JVD.     Trachea: No tracheal deviation.  Comments: Some left cervical muscle tightness/incl trap /not tender however  Cardiovascular:     Rate and Rhythm: Normal rate and regular rhythm.     Heart sounds: Normal heart sounds. No murmur heard. Pulmonary:     Effort: Pulmonary effort is normal. No respiratory distress.     Breath sounds: Normal breath sounds. No wheezing or rales.  Abdominal:     General: Bowel sounds are normal. There is no distension.     Palpations: Abdomen is soft. There is no mass.     Tenderness: There is no abdominal tenderness.  Musculoskeletal:        General: No tenderness.     Cervical back: Full passive range of motion without pain, normal range of motion and neck supple. No rigidity or tenderness.  Lymphadenopathy:     Cervical: No cervical adenopathy.  Skin:    General: Skin is warm and dry.     Coloration: Skin is not pale.     Findings: No rash.     Comments: No rashes   Neurological:     Mental Status: She is alert and oriented to person, place, and time.     Cranial Nerves: No cranial nerve deficit, dysarthria or facial asymmetry.     Sensory: No sensory deficit.     Motor: No weakness, tremor, atrophy or abnormal muscle tone.     Coordination: Romberg sign negative. Coordination normal.     Gait: Gait normal.     Deep Tendon Reflexes: Reflexes are normal and symmetric.     Comments: No focal cerebellar signs   Psychiatric:        Behavior: Behavior normal.        Thought Content: Thought content normal.           Assessment & Plan:   Problem List Items Addressed This Visit       Other   Light headedness - Primary    This occurs only when standing quickly after bending forward   Reassuring exam  Neg orthostatic blood pressure and pulse readings   Suspect this is related to position and baseline normal to low blood pressure  Instructed to stay hydrated  Stand more slowly /change positions slowly  Update if this worsens or if she develops other neuro symptoms  Reassuring exam Labs today      Relevant Orders   CBC with Differential/Platelet   Basic metabolic panel   TSH   Neck pain on left side    Left sided today  Acute on chronic-changes sides Story is consistent with muscle spasm (brief)  Normal exam and rom (some pain with rotation to right) Encouraged to make sure pillow fits her well  Trial of heat/ice  Given handout of neck exercises/ strain/ rehab to try  Update if not starting to improve in a week or if worsening  Call back and Er precautions noted in detail today         Relevant Orders   DG Cervical Spine Complete

## 2023-01-27 NOTE — Patient Instructions (Addendum)
When you bend over- stand up more slowly / take your time  This may be related to blood pressure (lower than it used to be)  Drink lots of fluids   Lab work today  Neurologic exam is reassuring   Let's get an xray of your neck  When you get the spasm - use some heat for 10 minutes   We should get a reading in 7-10 days

## 2023-01-27 NOTE — Assessment & Plan Note (Signed)
This occurs only when standing quickly after bending forward  Reassuring exam  Neg orthostatic blood pressure and pulse readings   Suspect this is related to position and baseline normal to low blood pressure  Instructed to stay hydrated  Stand more slowly /change positions slowly  Update if this worsens or if she develops other neuro symptoms  Reassuring exam Labs today

## 2023-02-19 NOTE — Progress Notes (Signed)
Referral done

## 2023-02-19 NOTE — Addendum Note (Signed)
Addended by: Roxy Manns A on: 02/19/2023 04:57 PM   Modules accepted: Orders

## 2023-02-25 ENCOUNTER — Encounter: Payer: Self-pay | Admitting: *Deleted

## 2023-07-20 NOTE — Progress Notes (Addendum)
 58 y.o. G63P1011 Married Caucasian female here for annual exam.  Last cycle May 2024.  Having hot flashes, a lot at night. Manageable.  States she is not seeking treatment.   Son married and has a new [place].   Sees her PCP for her labs.   PCP: Judy Pimple, MD  Endocrinology:  Dr. Verdis Frederickson.  No LMP recorded. (Menstrual status: Perimenopausal).           Sexually active: Yes.    The current method of family planning is vasectomy.    Menopausal hormone therapy:  n/a Exercising: No.   Smoker:  no  OB History  Gravida Para Term Preterm AB Living  2 1 1  1 1   SAB IAB Ectopic Multiple Live Births  1        # Outcome Date GA Lbr Len/2nd Weight Sex Type Anes PTL Lv  2 SAB 2006          1 Term 2000 [redacted]w[redacted]d   M         HEALTH MAINTENANCE: Last 2 paps:  12/14/18 neg: HR HPV neg, 06-27-15 Neg:Neg HR HPV  History of abnormal Pap or positive HPV:  no Mammogram:   07/20/23 Breast Density Cat C, BI-RADS CAT 1 neg Colonoscopy:  01/17/19 - precancerous polyps.  Due in 2027.  Bone Density:  n/a  Result  n/a  N/a Immunization History  Administered Date(s) Administered   Influenza Inj Mdck Quad Pf 02/03/2017   Influenza, Seasonal, Injecte, Preservative Fre 05/09/2015   Influenza,inj,Quad PF,6+ Mos 02/12/2018   Influenza-Unspecified 03/06/2014, 02/03/2017, 01/28/2018   PFIZER Comirnaty(Gray Top)Covid-19 Tri-Sucrose Vaccine 01/20/2020   PFIZER(Purple Top)SARS-COV-2 Vaccination 12/01/2019   Tdap 10/08/2011, 10/04/2019   Zoster Recombinant(Shingrix) 10/04/2019, 01/20/2020      reports that she has never smoked. She has never used smokeless tobacco. She reports that she does not drink alcohol and does not use drugs.  Past Medical History:  Diagnosis Date   Allergy    Seasonal allergies   Cancer (HCC)    Gallstones 05/05/2012   GERD (gastroesophageal reflux disease)    History of chicken pox    History of recurrent UTIs    Hyperlipemia    IBS (irritable bowel syndrome)     Migraines    with aura.     Seasonal allergies    Thyroid disease 10/16/2020   thyroid nodule removed--cancer   Urinary incontinence    with laughing,coughing,sneezing    Past Surgical History:  Procedure Laterality Date   CHOLECYSTECTOMY  2014   COLONOSCOPY  01/03/2014   COSMETIC SURGERY  1985   jaw surgery   DILATION AND CURETTAGE OF UTERUS     MANDIBLE SURGERY  1985   POLYPECTOMY     THYROIDECTOMY Left 10/16/2020   Procedure: HEMI THYROIDECTOMY;  Surgeon: Bud Face, MD;  Location: [facility];  Service: ENT;  Laterality: Left;    Current Outpatient Medications  Medication Sig Dispense Refill   Calcium-Magnesium-Vitamin D (CITRACAL SLOW RELEASE PO) Take 1 tablet by mouth in the morning.     loratadine (CLARITIN) 10 MG tablet Take 10 mg by mouth daily.     Sod Fluoride-Potassium Nitrate 1.1-5 % PSTE Place onto teeth.     No current facility-administered medications for this visit.    ALLERGIES: Amoxicillin and Cetirizine hcl  Family History  Problem Relation Age of Onset   Hyperlipidemia Mother        on medication recently   Irritable bowel syndrome Mother  Colon polyps Mother    Pulmonary fibrosis Father        double lung transplant   Cancer Father    Heart disease Maternal Grandfather    Bladder Cancer Maternal Grandmother    Osteoarthritis Paternal Grandmother    Arthritis Paternal Grandmother    Osteoarthritis Paternal Grandfather    Colon cancer Neg Hx    Esophageal cancer Neg Hx    Pancreatic cancer Neg Hx    Stomach cancer Neg Hx    Liver disease Neg Hx    Rectal cancer Neg Hx     Review of Systems  All other systems reviewed and are negative.   PHYSICAL EXAM:  BP 122/82 (BP Location: Left Arm, Patient Position: Sitting, Cuff Size: Small)   Ht 5' 7.5" (1.715 m)   Wt 149 lb (67.6 kg)   BMI 22.99 kg/m     General appearance: alert, cooperative and appears stated age Head: normocephalic, without obvious abnormality, atraumatic Neck:  no adenopathy, supple, symmetrical, trachea midline and thyroid normal to inspection and palpation Lungs: clear to auscultation bilaterally Breasts: normal appearance, no masses or tenderness, No nipple retraction or dimpling, No nipple discharge or bleeding, No axillary adenopathy Heart: regular rate and rhythm Abdomen: soft, non-tender; no masses, no organomegaly Extremities: extremities normal, atraumatic, no cyanosis or edema Skin: skin color, texture, turgor normal. No rashes or lesions Lymph nodes: cervical, supraclavicular, and axillary nodes normal. Neurologic: grossly normal  Pelvic: External genitalia:  no lesions              No abnormal inguinal nodes palpated.              Urethra:  normal appearing urethra with no masses, tenderness or lesions              Bartholins and Skenes: normal                 Vagina: normal appearing vagina with normal color and discharge, no lesions              Cervix: no lesions              Pap taken: Yes.   Bimanual Exam:  Uterus:  normal size, contour, position, consistency, mobility, non-tender              Adnexa: no mass, fullness, tenderness              Rectal exam: Yes.  .  Confirms.              Anus:  normal sphincter tone, no lesions  Chaperone was present for exam:  Warren Lacy, CMA  ASSESSMENT: Well woman visit with gynecologic exam. Cervical cancer screening.  Perimenopausal female.  Hx migraine with aura.  Hx thyroid cancer.  Status post partial thyroidectomy. PHQ-9: 0  PLAN: Mammogram screening discussed. Self breast awareness reviewed. Pap and HRV collected:  Yes.   Guidelines for Calcium, Vitamin D, regular exercise program including cardiovascular and weight bearing exercise. Medication refills:  NA We did discuss treatment options for menopausal symptoms if desired in the future:  HRT, SSRI/SNRI, gabapentin, Veozah, Estroven.   No Rx given today.  This was an introduction to some of the treatment options available.   Labs with PCP.  Follow up:  yearly and prn.

## 2023-07-22 ENCOUNTER — Encounter: Payer: Self-pay | Admitting: Obstetrics and Gynecology

## 2023-07-30 ENCOUNTER — Ambulatory Visit: Payer: Managed Care, Other (non HMO) | Admitting: Obstetrics and Gynecology

## 2023-08-03 ENCOUNTER — Ambulatory Visit (INDEPENDENT_AMBULATORY_CARE_PROVIDER_SITE_OTHER): Admitting: Obstetrics and Gynecology

## 2023-08-03 ENCOUNTER — Encounter: Payer: Self-pay | Admitting: Obstetrics and Gynecology

## 2023-08-03 ENCOUNTER — Other Ambulatory Visit (HOSPITAL_COMMUNITY)
Admission: RE | Admit: 2023-08-03 | Discharge: 2023-08-03 | Disposition: A | Discharge status: Home / Self Care | Source: Ambulatory Visit | Attending: Obstetrics and Gynecology | Admitting: Obstetrics and Gynecology

## 2023-08-03 ENCOUNTER — Telehealth: Payer: Self-pay | Admitting: *Deleted

## 2023-08-03 VITALS — BP 122/82 | Ht 67.5 in | Wt 149.0 lb

## 2023-08-03 DIAGNOSIS — Z01419 Encounter for gynecological examination (general) (routine) without abnormal findings: Secondary | ICD-10-CM

## 2023-08-03 DIAGNOSIS — Z124 Encounter for screening for malignant neoplasm of cervix: Secondary | ICD-10-CM | POA: Insufficient documentation

## 2023-08-03 DIAGNOSIS — Z1331 Encounter for screening for depression: Secondary | ICD-10-CM | POA: Diagnosis not present

## 2023-08-03 NOTE — Telephone Encounter (Signed)
 Copied from CRM [id number]. Topic: Appointments - Appointment Scheduling >> Aug 03, 2023  2:37 PM Truddie Crumble wrote: Patient/patient representative is calling to schedule an appointment. Refer to attachments for appointment information.   Patient called stating she usually get lab work done before her physical appointment but there are no lab orders in

## 2023-08-03 NOTE — Telephone Encounter (Signed)
 Please schedule labs prior to CPE, PCP will put orders in closer to date

## 2023-08-03 NOTE — Patient Instructions (Signed)

## 2023-08-05 LAB — CYTOLOGY - PAP
Comment: NEGATIVE
Diagnosis: NEGATIVE
High risk HPV: NEGATIVE

## 2023-08-11 ENCOUNTER — Encounter: Payer: Self-pay | Admitting: Obstetrics and Gynecology

## 2023-09-18 ENCOUNTER — Encounter: Payer: Self-pay | Admitting: Obstetrics and Gynecology

## 2023-10-11 ENCOUNTER — Telehealth: Payer: Self-pay | Admitting: Family Medicine

## 2023-10-11 DIAGNOSIS — E7800 Pure hypercholesterolemia, unspecified: Secondary | ICD-10-CM

## 2023-10-11 DIAGNOSIS — E041 Nontoxic single thyroid nodule: Secondary | ICD-10-CM

## 2023-10-11 DIAGNOSIS — Z0000 Encounter for general adult medical examination without abnormal findings: Secondary | ICD-10-CM

## 2023-10-11 NOTE — Telephone Encounter (Signed)
-----   Message from Gerry Krone sent at 09/29/2023  2:46 PM EDT ----- Regarding: Lab orders for Tues,6.10.25 Patient is scheduled for CPX labs, please order future labs, Thanks , Anselmo Kings

## 2023-10-11 NOTE — Telephone Encounter (Signed)
-----   Message from Gerry Krone sent at 10/06/2023  3:54 PM EDT ----- Regarding: Lab orders for Wed, 6.11.25 Patient is scheduled for CPX labs, please order future labs, Thanks , Anselmo Kings

## 2023-10-13 ENCOUNTER — Other Ambulatory Visit

## 2023-10-14 ENCOUNTER — Other Ambulatory Visit (INDEPENDENT_AMBULATORY_CARE_PROVIDER_SITE_OTHER)

## 2023-10-14 DIAGNOSIS — E041 Nontoxic single thyroid nodule: Secondary | ICD-10-CM | POA: Diagnosis not present

## 2023-10-14 DIAGNOSIS — E7800 Pure hypercholesterolemia, unspecified: Secondary | ICD-10-CM

## 2023-10-14 DIAGNOSIS — Z0000 Encounter for general adult medical examination without abnormal findings: Secondary | ICD-10-CM

## 2023-10-14 NOTE — Addendum Note (Signed)
 Addended by: Gerry Krone on: 10/14/2023 07:48 AM   Modules accepted: Orders

## 2023-10-15 ENCOUNTER — Ambulatory Visit: Payer: Self-pay | Admitting: Family Medicine

## 2023-10-15 LAB — COMPREHENSIVE METABOLIC PANEL WITH GFR
AG Ratio: 1.6 (calc) (ref 1.0–2.5)
ALT: 11 U/L (ref 6–29)
AST: 16 U/L (ref 10–35)
Albumin: 4.1 g/dL (ref 3.6–5.1)
Alkaline phosphatase (APISO): 58 U/L (ref 37–153)
BUN: 15 mg/dL (ref 7–25)
CO2: 23 mmol/L (ref 20–32)
Calcium: 9 mg/dL (ref 8.6–10.4)
Chloride: 108 mmol/L (ref 98–110)
Creat: 0.83 mg/dL (ref 0.50–1.03)
Globulin: 2.6 g/dL (ref 1.9–3.7)
Glucose, Bld: 92 mg/dL (ref 65–99)
Potassium: 4.9 mmol/L (ref 3.5–5.3)
Sodium: 142 mmol/L (ref 135–146)
Total Bilirubin: 0.3 mg/dL (ref 0.2–1.2)
Total Protein: 6.7 g/dL (ref 6.1–8.1)
eGFR: 83 mL/min/{1.73_m2} (ref >=60)

## 2023-10-15 LAB — LIPID PANEL
Cholesterol: 226 mg/dL — ABNORMAL HIGH (ref <200)
HDL: 45 mg/dL — ABNORMAL LOW (ref >=50)
LDL Cholesterol (Calc): 160 mg/dL — ABNORMAL HIGH
Non-HDL Cholesterol (Calc): 181 mg/dL — ABNORMAL HIGH (ref <130)
Total CHOL/HDL Ratio: 5 (calc) — ABNORMAL HIGH (ref <5.0)
Triglycerides: 101 mg/dL (ref <150)

## 2023-10-15 LAB — CBC WITH DIFFERENTIAL/PLATELET
Absolute Lymphocytes: 1187 {cells}/uL (ref 850–3900)
Absolute Monocytes: 439 {cells}/uL (ref 200–950)
Basophils Absolute: 22 {cells}/uL (ref 0–200)
Basophils Relative: 0.5 %
Eosinophils Absolute: 9 {cells}/uL — ABNORMAL LOW (ref 15–500)
Eosinophils Relative: 0.2 %
HCT: 39.3 % (ref 35.0–45.0)
Hemoglobin: 13.1 g/dL (ref 11.7–15.5)
MCH: 28.9 pg (ref 27.0–33.0)
MCHC: 33.3 g/dL (ref 32.0–36.0)
MCV: 86.8 fL (ref 80.0–100.0)
MPV: 9 fL (ref 7.5–12.5)
Monocytes Relative: 10.2 %
Neutro Abs: 2645 {cells}/uL (ref 1500–7800)
Neutrophils Relative %: 61.5 %
Platelets: 260 10*3/uL (ref 140–400)
RBC: 4.53 10*6/uL (ref 3.80–5.10)
RDW: 13.1 % (ref 11.0–15.0)
Total Lymphocyte: 27.6 %
WBC: 4.3 10*3/uL (ref 3.8–10.8)

## 2023-10-15 LAB — TSH: TSH: 3.28 m[IU]/L

## 2023-10-20 ENCOUNTER — Ambulatory Visit (INDEPENDENT_AMBULATORY_CARE_PROVIDER_SITE_OTHER): Admitting: Family Medicine

## 2023-10-20 ENCOUNTER — Encounter: Payer: Self-pay | Admitting: Family Medicine

## 2023-10-20 VITALS — BP 102/70 | HR 56 | Temp 98.1°F | Ht 66.0 in | Wt 148.5 lb

## 2023-10-20 DIAGNOSIS — E7800 Pure hypercholesterolemia, unspecified: Secondary | ICD-10-CM | POA: Diagnosis not present

## 2023-10-20 DIAGNOSIS — Z0000 Encounter for general adult medical examination without abnormal findings: Secondary | ICD-10-CM

## 2023-10-20 DIAGNOSIS — Z85850 Personal history of malignant neoplasm of thyroid: Secondary | ICD-10-CM

## 2023-10-20 DIAGNOSIS — D126 Benign neoplasm of colon, unspecified: Secondary | ICD-10-CM | POA: Diagnosis not present

## 2023-10-20 MED ORDER — ROSUVASTATIN CALCIUM 10 MG PO TABS: 10.0000 mg | ORAL_TABLET | Freq: Every day | ORAL | 3 refills | Status: DC

## 2023-10-20 NOTE — Assessment & Plan Note (Signed)
Colonoscopy 2020 with 7 y recall

## 2023-10-20 NOTE — Assessment & Plan Note (Signed)
 Worse than last check Disc goals for lipids and reasons to control them Rev last labs with pt Rev low sat fat diet in detail LDL up to 160 despite good diet  Plan to start rosuvastatin 10 mg daily  Reviewed possible side effects   Re check lab 6 wk

## 2023-10-20 NOTE — Assessment & Plan Note (Signed)
 Doing well s/p hemithyroidectomy  TSH in normal range Continues endo care

## 2023-10-20 NOTE — Patient Instructions (Addendum)
 Stay active   Add some strength training to your routine, this is important for bone and brain health and can reduce your risk of falls and help your body use insulin properly and regulate weight  Light weights, exercise bands , and internet videos are a good way to start  Yoga (chair or regular), machines , floor exercises or a gym with machines are also good options     For cholesterol Avoid red meat/ fried foods/ egg yolks/(ok in moderation)  fatty breakfast meats/ butter, cheese and high fat dairy/ and shellfish  Start generic crestor 10 mg daily in evening  We will check cholesterol in 6 weeks

## 2023-10-20 NOTE — Assessment & Plan Note (Signed)
 Reviewed health habits including diet and exercise and skin cancer prevention Reviewed appropriate screening tests for age  Also reviewed health mt list, fam hx and immunization status , as well as social and family history   See HPI Labs reviewed and ordered Health Maintenance  Topic Date Due   COVID-19 Vaccine (3 - Pfizer risk series) 02/12/2024*   Hepatitis C Screening  10/19/2024*   Flu Shot  12/04/2023   Mammogram  07/19/2024   Colon Cancer Screening  01/16/2026   Pap with HPV screening  08/02/2028   DTaP/Tdap/Td vaccine (3 - Td or Tdap) 10/03/2029   HIV Screening  Completed   Zoster (Shingles) Vaccine  Completed   HPV Vaccine  Aged Out   Meningitis B Vaccine  Aged Out  *Topic was postponed. The date shown is not the original due date.   Utd gyn care  Discussed fall prevention, supplements and exercise for bone density  Phq 0

## 2023-10-20 NOTE — Progress Notes (Signed)
 Subjective:    Patient ID: Nicole Shelton, female    DOB: [birth year], 58 y.o.   MRN: [id number]  HPI  Here for health maintenance exam and to review chronic medical problems   Wt Readings from Last 3 Encounters:  10/20/23 148 lb 8 oz (67.4 kg)  08/03/23 149 lb (67.6 kg)  01/27/23 146 lb 6 oz (66.4 kg)   23.97 kg/m  Vitals:   10/20/23 0927  BP: 102/70  Pulse: (!) 56  Temp: 98.1 F (36.7 C)  SpO2: 100%    Immunization History  Administered Date(s) Administered   Influenza Inj Mdck Quad Pf 02/03/2017   Influenza, Seasonal, Injecte, Preservative Fre 05/09/2015   Influenza,inj,Quad PF,6+ Mos 02/12/2018   Influenza-Unspecified 03/06/2014, 02/03/2017, 01/28/2018   PFIZER Comirnaty(Gray Top)Covid-19 Tri-Sucrose Vaccine 01/20/2020   PFIZER(Purple Top)SARS-COV-2 Vaccination 12/01/2019   Tdap 10/08/2011, 10/04/2019   Zoster Recombinant(Shingrix) 10/04/2019, 01/20/2020    There are no preventive care reminders to display for this patient.  Physically feeling ok   Mammogram 07/2523 Self breast exam- no lumps or changes   Gyn health Sees gyn  pap 07/2023  Over a year since last period -some hot flashes  Takes estroven and it helps some    Colon cancer screening colonoscopy 01/2019 with 7 y recall   Bone health   Falls-none  Fractures-none  Supplements - taking ca plus D / caltrate   Exercise  Walking outside regularly  Working in the yard    Mood    10/20/2023    9:30 AM 08/03/2023    8:50 AM 01/27/2023   11:39 AM 10/14/2022    9:08 AM 10/10/2021    9:57 AM  Depression screen PHQ 2/9  Decreased Interest 0 0 0 0 0  Down, Depressed, Hopeless 0 0 0 0 0  PHQ - 2 Score 0 0 0 0 0  Altered sleeping 0  0 0   Tired, decreased energy 0  0 0   Change in appetite 0  0 0   Feeling bad or failure about yourself  0  0 0   Trouble concentrating 0  0 0   Moving slowly or fidgety/restless 0  0 0   Suicidal thoughts 0  0 0   PHQ-9 Score 0  0 0   Difficult doing  work/chores Not difficult at all  Not difficult at all Not difficult at all    A lot going on  MIL had a stroke a week ago     Thyroid  -history of cancer / had hemithyroidectomy  Good US  in January No supplementation  Sees endo, Dr Shelvy Dickens  Lab Results  Component Value Date   TSH 3.28 10/14/2023      Hyperlipidemia Lab Results  Component Value Date   CHOL 226 (H) 10/14/2023   CHOL 206 (H) 10/07/2022   CHOL 219 (H) 10/02/2021   Lab Results  Component Value Date   HDL 45 (L) 10/14/2023   HDL 35.50 (L) 10/07/2022   HDL 45.50 10/02/2021   Lab Results  Component Value Date   LDLCALC 160 (H) 10/14/2023   LDLCALC 144 (H) 10/07/2022   LDLCALC 145 (H) 10/02/2021   Lab Results  Component Value Date   TRIG 101 10/14/2023   TRIG 134.0 10/07/2022   TRIG 144.0 10/02/2021   Lab Results  Component Value Date   CHOLHDL 5.0 (H) 10/14/2023   CHOLHDL 6 10/07/2022   CHOLHDL 5 10/02/2021   Lab Results  Component Value Date   LDLDIRECT 124.0  09/19/2020   LDLDIRECT 139.5 10/06/2011   LDLDIRECT 149.0 11/27/2008   LDL is up from 144 to 160  Lost 30 lb eating better this year   Eating lean thing/not high fat   Avoids high cholesterol foods  Does eat eggs  Eating more protein in general   Is open to medication now    Lab Results  Component Value Date   ALT 11 10/14/2023   AST 16 10/14/2023   ALKPHOS 50 10/07/2022   BILITOT 0.3 10/14/2023   Lab Results  Component Value Date   NA 142 10/14/2023   K 4.9 10/14/2023   CO2 23 10/14/2023   GLUCOSE 92 10/14/2023   BUN 15 10/14/2023   CREATININE 0.83 10/14/2023   CALCIUM 9.0 10/14/2023   GFR 92.94 01/27/2023   EGFR 83 10/14/2023   GFRNONAA >60 04/08/2022   Lab Results  Component Value Date   WBC 4.3 10/14/2023   HGB 13.1 10/14/2023   HCT 39.3 10/14/2023   MCV 86.8 10/14/2023   PLT 260 10/14/2023     Patient Active Problem List   Diagnosis Date Noted   Light headedness 01/27/2023   Neck pain on left side  01/27/2023   History of thyroid  cancer 10/10/2021   Frequent headaches 10/02/2020   Thyroid  nodule 10/02/2020   Gastroesophageal reflux disease 04/25/2020   Adenomatous colon polyp 03/02/2014   Routine general medical examination at a health care facility 10/06/2011   Hyperlipidemia 04/11/2008   IRRITABLE BOWEL SYNDROME 04/11/2008   Past Medical History:  Diagnosis Date   Allergy    Seasonal allergies   Cancer (HCC)    Thyroid  nodule, left side hemithyroidectomy to remove   Gallstones 05/05/2012   GERD (gastroesophageal reflux disease)    History of chicken pox    History of recurrent UTIs    Hyperlipemia    IBS (irritable bowel syndrome)    Migraines    with aura.     Seasonal allergies    Thyroid  disease 10/16/2020   thyroid  nodule removed--cancer   Urinary incontinence    with laughing,coughing,sneezing   Past Surgical History:  Procedure Laterality Date   CHOLECYSTECTOMY  2014   COLONOSCOPY  01/03/2014   COSMETIC SURGERY  1985   jaw surgery   DILATION AND CURETTAGE OF UTERUS     MANDIBLE SURGERY  1985   POLYPECTOMY     THYROIDECTOMY Left 10/16/2020   Procedure: HEMI THYROIDECTOMY;  Surgeon: Rogers Clayman, MD;  Location: [facility];  Service: ENT;  Laterality: Left;   Social History   Tobacco Use   Smoking status: Never   Smokeless tobacco: Never  Vaping Use   Vaping status: Never Used  Substance Use Topics   Alcohol use: No   Drug use: Never   Family History  Problem Relation Age of Onset   Hyperlipidemia Mother        on medication recently   Irritable bowel syndrome Mother    Colon polyps Mother    Pulmonary fibrosis Father        double lung transplant   Cancer Father    Heart disease Maternal Grandfather    Bladder Cancer Maternal Grandmother    Osteoarthritis Paternal Grandmother    Arthritis Paternal Grandmother    Osteoarthritis Paternal Grandfather    Colon cancer Neg Hx    Esophageal cancer Neg Hx    Pancreatic cancer Neg Hx     Stomach cancer Neg Hx    Liver disease Neg Hx    Rectal cancer  Neg Hx    Allergies  Allergen Reactions   Amoxicillin Other (See Comments)    back, leg pain   Cetirizine Hcl Other (See Comments)    sedation   Current Outpatient Medications on File Prior to Visit  Medication Sig Dispense Refill   Calcium-Magnesium-Vitamin D (CITRACAL SLOW RELEASE PO) Take 1 tablet by mouth in the morning.     loratadine (CLARITIN) 10 MG tablet Take 10 mg by mouth daily.     Rhubarb (ESTROVEN COMPLETE PO) Take 1 capsule by mouth daily.     Sod Fluoride-Potassium Nitrate 1.1-5 % PSTE Place onto teeth.     No current facility-administered medications on file prior to visit.    Review of Systems  Constitutional:  Negative for activity change, appetite change, fatigue, fever and unexpected weight change.  HENT:  Negative for congestion, ear pain, rhinorrhea, sinus pressure and sore throat.   Eyes:  Negative for pain, redness and visual disturbance.  Respiratory:  Negative for cough, shortness of breath and wheezing.   Cardiovascular:  Negative for chest pain and palpitations.  Gastrointestinal:  Negative for abdominal pain, blood in stool, constipation and diarrhea.  Endocrine: Negative for polydipsia and polyuria.  Genitourinary:  Negative for dysuria, frequency and urgency.  Musculoskeletal:  Negative for arthralgias, back pain and myalgias.  Skin:  Negative for pallor and rash.  Allergic/Immunologic: Negative for environmental allergies.  Neurological:  Negative for dizziness, syncope and headaches.  Hematological:  Negative for adenopathy. Does not bruise/bleed easily.  Psychiatric/Behavioral:  Negative for decreased concentration and dysphoric mood. The patient is not nervous/anxious.        Stressors noted        Objective:   Physical Exam Constitutional:      General: She is not in acute distress.    Appearance: Normal appearance. She is well-developed and normal weight. She is not  ill-appearing or diaphoretic.  HENT:     Head: Normocephalic and atraumatic.     Right Ear: Tympanic membrane, ear canal and external ear normal.     Left Ear: Tympanic membrane, ear canal and external ear normal.     Nose: Nose normal. No congestion.     Mouth/Throat:     Mouth: Mucous membranes are moist.     Pharynx: Oropharynx is clear. No posterior oropharyngeal erythema.   Eyes:     General: No scleral icterus.    Extraocular Movements: Extraocular movements intact.     Conjunctiva/sclera: Conjunctivae normal.     Pupils: Pupils are equal, round, and reactive to light.   Neck:     Thyroid : No thyromegaly.     Vascular: No carotid bruit or JVD.   Cardiovascular:     Rate and Rhythm: Normal rate and regular rhythm.     Pulses: Normal pulses.     Heart sounds: Normal heart sounds.     No gallop.  Pulmonary:     Effort: Pulmonary effort is normal. No respiratory distress.     Breath sounds: Normal breath sounds. No wheezing.     Comments: Good air exch Chest:     Chest wall: No tenderness.  Abdominal:     General: Bowel sounds are normal. There is no distension or abdominal bruit.     Palpations: Abdomen is soft. There is no mass.     Tenderness: There is no abdominal tenderness.     Hernia: No hernia is present.  Genitourinary:    Comments: Breast and pelvic exam are done by gyn provider  Musculoskeletal:        General: No tenderness. Normal range of motion.     Cervical back: Normal range of motion and neck supple. No rigidity. No muscular tenderness.     Right lower leg: No edema.     Left lower leg: No edema.     Comments: No kyphosis   Lymphadenopathy:     Cervical: No cervical adenopathy.   Skin:    General: Skin is warm and dry.     Coloration: Skin is not pale.     Findings: No erythema or rash.     Comments: Solar lentigines diffusely    Neurological:     Mental Status: She is alert. Mental status is at baseline.     Cranial Nerves: No cranial  nerve deficit.     Motor: No abnormal muscle tone.     Coordination: Coordination normal.     Gait: Gait normal.     Deep Tendon Reflexes: Reflexes are normal and symmetric. Reflexes normal.   Psychiatric:        Mood and Affect: Mood normal.        Cognition and Memory: Cognition and memory normal.           Assessment & Plan:   Problem List Items Addressed This Visit       Digestive   Adenomatous colon polyp   Colonoscopy 2020 with 7 y recall        Other   Routine general medical examination at a health care facility - Primary   Reviewed health habits including diet and exercise and skin cancer prevention Reviewed appropriate screening tests for age  Also reviewed health mt list, fam hx and immunization status , as well as social and family history   See HPI Labs reviewed and ordered Health Maintenance  Topic Date Due   COVID-19 Vaccine (3 - Pfizer risk series) 02/12/2024*   Hepatitis C Screening  10/19/2024*   Flu Shot  12/04/2023   Mammogram  07/19/2024   Colon Cancer Screening  01/16/2026   Pap with HPV screening  08/02/2028   DTaP/Tdap/Td vaccine (3 - Td or Tdap) 10/03/2029   HIV Screening  Completed   Zoster (Shingles) Vaccine  Completed   HPV Vaccine  Aged Out   Meningitis B Vaccine  Aged Out  *Topic was postponed. The date shown is not the original due date.   Utd gyn care  Discussed fall prevention, supplements and exercise for bone density  Phq 0        Hyperlipidemia   Worse than last check Disc goals for lipids and reasons to control them Rev last labs with pt Rev low sat fat diet in detail LDL up to 160 despite good diet  Plan to start rosuvastatin 10 mg daily  Reviewed possible side effects   Re check lab 6 wk       Relevant Medications   rosuvastatin (CRESTOR) 10 MG tablet   Other Relevant Orders   Lipid panel   ALT   AST   History of thyroid  cancer   Doing well s/p hemithyroidectomy  TSH in normal range Continues endo  care

## 2023-11-12 ENCOUNTER — Other Ambulatory Visit: Payer: Self-pay | Admitting: Neurology

## 2023-11-12 DIAGNOSIS — G43109 Migraine with aura, not intractable, without status migrainosus: Secondary | ICD-10-CM

## 2023-11-18 ENCOUNTER — Ambulatory Visit
Admission: RE | Admit: 2023-11-18 | Discharge: 2023-11-18 | Disposition: A | Discharge status: Home / Self Care | Source: Ambulatory Visit | Attending: Neurology | Admitting: Neurology

## 2023-11-18 DIAGNOSIS — G43109 Migraine with aura, not intractable, without status migrainosus: Secondary | ICD-10-CM | POA: Insufficient documentation

## 2023-12-01 ENCOUNTER — Ambulatory Visit: Payer: Self-pay | Admitting: Family Medicine

## 2023-12-01 ENCOUNTER — Other Ambulatory Visit (INDEPENDENT_AMBULATORY_CARE_PROVIDER_SITE_OTHER)

## 2023-12-01 DIAGNOSIS — E7800 Pure hypercholesterolemia, unspecified: Secondary | ICD-10-CM | POA: Diagnosis not present

## 2023-12-01 LAB — LIPID PANEL
Cholesterol: 136 mg/dL (ref 0–200)
HDL: 44.5 mg/dL (ref >39.00)
LDL Cholesterol: 67 mg/dL (ref 0–99)
NonHDL: 91.69
Total CHOL/HDL Ratio: 3
Triglycerides: 122 mg/dL (ref 0.0–149.0)
VLDL: 24.4 mg/dL (ref 0.0–40.0)

## 2023-12-01 LAB — ALT: ALT: 15 U/L (ref 0–35)

## 2023-12-01 LAB — AST: AST: 18 U/L (ref 0–37)

## 2024-01-19 ENCOUNTER — Encounter: Payer: Self-pay | Admitting: Family Medicine

## 2024-01-28 MED ORDER — EZETIMIBE 10 MG PO TABS: 10.0000 mg | ORAL_TABLET | Freq: Every day | ORAL | 3 refills | Status: AC

## 2024-08-04 ENCOUNTER — Ambulatory Visit: Admitting: Obstetrics and Gynecology
